# Patient Record
Sex: Female | Born: 1990 | Race: White | Hispanic: No | Marital: Single | Smoking: Never smoker
Health system: Southern US, Community
[De-identification: ages and names within clinical notes are randomized; demographics above are authoritative.]

## PROBLEM LIST (undated history)

## (undated) DIAGNOSIS — S069X9A Unspecified intracranial injury with loss of consciousness of unspecified duration, initial encounter: Secondary | ICD-10-CM

## (undated) DIAGNOSIS — T8859XA Other complications of anesthesia, initial encounter: Secondary | ICD-10-CM

## (undated) DIAGNOSIS — F909 Attention-deficit hyperactivity disorder, unspecified type: Secondary | ICD-10-CM

## (undated) DIAGNOSIS — Z8489 Family history of other specified conditions: Secondary | ICD-10-CM

## (undated) DIAGNOSIS — F429 Obsessive-compulsive disorder, unspecified: Secondary | ICD-10-CM

## (undated) DIAGNOSIS — Z9889 Other specified postprocedural states: Secondary | ICD-10-CM

## (undated) DIAGNOSIS — G43909 Migraine, unspecified, not intractable, without status migrainosus: Secondary | ICD-10-CM

## (undated) DIAGNOSIS — J189 Pneumonia, unspecified organism: Secondary | ICD-10-CM

## (undated) DIAGNOSIS — I10 Essential (primary) hypertension: Secondary | ICD-10-CM

## (undated) DIAGNOSIS — E282 Polycystic ovarian syndrome: Secondary | ICD-10-CM

## (undated) HISTORY — DX: Attention-deficit hyperactivity disorder, unspecified type: F90.9

## (undated) HISTORY — DX: Unspecified intracranial injury with loss of consciousness of unspecified duration, initial encounter: S06.9X9A

## (undated) HISTORY — PX: KNEE SURGERY: SHX244

---
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
## 2009-10-09 HISTORY — PX: RHINOPLASTY: SUR1284

## 2010-10-09 DIAGNOSIS — S069X9A Unspecified intracranial injury with loss of consciousness of unspecified duration, initial encounter: Secondary | ICD-10-CM

## 2010-10-09 DIAGNOSIS — S069XAA Unspecified intracranial injury with loss of consciousness status unknown, initial encounter: Secondary | ICD-10-CM

## 2010-10-09 HISTORY — DX: Unspecified intracranial injury with loss of consciousness status unknown, initial encounter: S06.9XAA

## 2010-10-09 HISTORY — DX: Unspecified intracranial injury with loss of consciousness of unspecified duration, initial encounter: S06.9X9A

## 2011-03-23 ENCOUNTER — Encounter: Payer: Self-pay | Admitting: Family Medicine

## 2011-06-28 ENCOUNTER — Emergency Department (HOSPITAL_COMMUNITY)
Admission: EM | Admit: 2011-06-28 | Discharge: 2011-06-28 | Disposition: A | Discharge status: Home / Self Care | Attending: Emergency Medicine | Admitting: Emergency Medicine

## 2011-06-28 ENCOUNTER — Emergency Department (HOSPITAL_COMMUNITY)

## 2011-06-28 DIAGNOSIS — Y92009 Unspecified place in unspecified non-institutional (private) residence as the place of occurrence of the external cause: Secondary | ICD-10-CM | POA: Insufficient documentation

## 2011-06-28 DIAGNOSIS — R42 Dizziness and giddiness: Secondary | ICD-10-CM | POA: Insufficient documentation

## 2011-06-28 DIAGNOSIS — S060X0A Concussion without loss of consciousness, initial encounter: Secondary | ICD-10-CM | POA: Insufficient documentation

## 2011-06-28 DIAGNOSIS — IMO0002 Reserved for concepts with insufficient information to code with codable children: Secondary | ICD-10-CM | POA: Insufficient documentation

## 2011-06-28 DIAGNOSIS — R51 Headache: Secondary | ICD-10-CM | POA: Insufficient documentation

## 2015-06-19 ENCOUNTER — Encounter (HOSPITAL_COMMUNITY): Payer: Self-pay | Admitting: Emergency Medicine

## 2015-06-19 ENCOUNTER — Emergency Department (HOSPITAL_COMMUNITY)

## 2015-06-19 ENCOUNTER — Emergency Department (HOSPITAL_COMMUNITY)
Admission: EM | Admit: 2015-06-19 | Discharge: 2015-06-20 | Disposition: A | Discharge status: Home / Self Care | Payer: Self-pay | Attending: Emergency Medicine | Admitting: Emergency Medicine

## 2015-06-19 DIAGNOSIS — Z3202 Encounter for pregnancy test, result negative: Secondary | ICD-10-CM | POA: Insufficient documentation

## 2015-06-19 DIAGNOSIS — Z79899 Other long term (current) drug therapy: Secondary | ICD-10-CM | POA: Insufficient documentation

## 2015-06-19 DIAGNOSIS — N200 Calculus of kidney: Secondary | ICD-10-CM | POA: Insufficient documentation

## 2015-06-19 LAB — COMPREHENSIVE METABOLIC PANEL
ALBUMIN: 5 g/dL (ref 3.5–5.0)
ALT: 94 U/L — ABNORMAL HIGH (ref 14–54)
ANION GAP: 8 (ref 5–15)
AST: 56 U/L — ABNORMAL HIGH (ref 15–41)
Alkaline Phosphatase: 70 U/L (ref 38–126)
BILIRUBIN TOTAL: 0.6 mg/dL (ref 0.3–1.2)
BUN: 12 mg/dL (ref 6–20)
CO2: 31 mmol/L (ref 22–32)
Calcium: 9.9 mg/dL (ref 8.9–10.3)
Chloride: 102 mmol/L (ref 101–111)
Creatinine, Ser: 1.24 mg/dL — ABNORMAL HIGH (ref 0.44–1.00)
GFR calc Af Amer: >60 mL/min (ref >60)
GFR, EST NON AFRICAN AMERICAN: 59 mL/min — AB (ref >60)
Glucose, Bld: 123 mg/dL — ABNORMAL HIGH (ref 65–99)
POTASSIUM: 3.6 mmol/L (ref 3.5–5.1)
Sodium: 141 mmol/L (ref 135–145)
TOTAL PROTEIN: 8.9 g/dL — AB (ref 6.5–8.1)

## 2015-06-19 LAB — URINALYSIS, ROUTINE W REFLEX MICROSCOPIC
BILIRUBIN URINE: NEGATIVE
Glucose, UA: NEGATIVE mg/dL
KETONES UR: NEGATIVE mg/dL
Leukocytes, UA: NEGATIVE
NITRITE: NEGATIVE
PROTEIN: NEGATIVE mg/dL
Specific Gravity, Urine: 1.02 (ref 1.005–1.030)
UROBILINOGEN UA: 0.2 mg/dL (ref 0.0–1.0)
pH: 6.5 (ref 5.0–8.0)

## 2015-06-19 LAB — CBC WITH DIFFERENTIAL/PLATELET
BASOS PCT: 0 % (ref 0–1)
Basophils Absolute: 0 10*3/uL (ref 0.0–0.1)
EOS PCT: 0 % (ref 0–5)
Eosinophils Absolute: 0 10*3/uL (ref 0.0–0.7)
HEMATOCRIT: 45 % (ref 36.0–46.0)
Hemoglobin: 15.5 g/dL — ABNORMAL HIGH (ref 12.0–15.0)
Lymphocytes Relative: 16 % (ref 12–46)
Lymphs Abs: 2.1 10*3/uL (ref 0.7–4.0)
MCH: 31.5 pg (ref 26.0–34.0)
MCHC: 34.4 g/dL (ref 30.0–36.0)
MCV: 91.5 fL (ref 78.0–100.0)
MONO ABS: 0.7 10*3/uL (ref 0.1–1.0)
MONOS PCT: 5 % (ref 3–12)
NEUTROS ABS: 10.2 10*3/uL — AB (ref 1.7–7.7)
Neutrophils Relative %: 79 % — ABNORMAL HIGH (ref 43–77)
Platelets: 329 10*3/uL (ref 150–400)
RBC: 4.92 MIL/uL (ref 3.87–5.11)
RDW: 12.4 % (ref 11.5–15.5)
WBC: 13 10*3/uL — ABNORMAL HIGH (ref 4.0–10.5)

## 2015-06-19 LAB — URINE MICROSCOPIC-ADD ON

## 2015-06-19 LAB — PREGNANCY, URINE: PREG TEST UR: NEGATIVE

## 2015-06-19 LAB — LIPASE, BLOOD: LIPASE: 20 U/L — AB (ref 22–51)

## 2015-06-19 MED ORDER — IOHEXOL 300 MG/ML  SOLN
100.0000 mL | Freq: Once | INTRAMUSCULAR | Status: AC | PRN
  Administered 2015-06-19: 100 mL via INTRAVENOUS

## 2015-06-19 MED ORDER — HYDROMORPHONE HCL 1 MG/ML IJ SOLN
1.0000 mg | Freq: Once | INTRAMUSCULAR | Status: AC
  Administered 2015-06-19: 1 mg via INTRAVENOUS
  Filled 2015-06-19: qty 1

## 2015-06-19 MED ORDER — HYDROMORPHONE HCL 2 MG PO TABS: 1.0000 mg | ORAL_TABLET | ORAL | Status: DC | PRN

## 2015-06-19 MED ORDER — ONDANSETRON 4 MG PO TBDP
4.0000 mg | ORAL_TABLET | Freq: Once | ORAL | Status: AC
  Administered 2015-06-19: 4 mg via ORAL
  Filled 2015-06-19: qty 1

## 2015-06-19 MED ORDER — SODIUM CHLORIDE 0.9 % IV SOLN
1000.0000 mL | Freq: Once | INTRAVENOUS | Status: AC
  Administered 2015-06-19: 1000 mL via INTRAVENOUS

## 2015-06-19 MED ORDER — ONDANSETRON HCL 4 MG/2ML IJ SOLN
4.0000 mg | Freq: Once | INTRAMUSCULAR | Status: AC
  Administered 2015-06-19: 4 mg via INTRAVENOUS
  Filled 2015-06-19: qty 2

## 2015-06-19 MED ORDER — SODIUM CHLORIDE 0.9 % IV SOLN
1000.0000 mL | INTRAVENOUS | Status: DC
  Administered 2015-06-19: 1000 mL via INTRAVENOUS

## 2015-06-19 MED ORDER — IOHEXOL 300 MG/ML  SOLN
25.0000 mL | Freq: Once | INTRAMUSCULAR | Status: AC | PRN
  Administered 2015-06-19: 25 mL via ORAL

## 2015-06-19 MED ORDER — GI COCKTAIL ~~LOC~~
30.0000 mL | Freq: Once | ORAL | Status: AC
  Administered 2015-06-19: 30 mL via ORAL
  Filled 2015-06-19: qty 30

## 2015-06-19 NOTE — ED Notes (Signed)
Patient transported to CT 

## 2015-06-19 NOTE — ED Provider Notes (Signed)
CSN: [id number]     Arrival date & time 06/19/15  1819 History   First MD Initiated Contact with Patient 06/19/15 1958     Chief Complaint  Patient presents with  . Abdominal Pain    right  . Emesis     (Consider location/radiation/quality/duration/timing/severity/associated sxs/prior Treatment) HPI  Stacye Noori is a 24 y.o. female with no significant PMH IBSD who presents with sudden onset right back pain that has since localized to the right lower quadrant. She describes it as sharp and constant and excruciating.  She endorses nausea, vomiting (x5), chills, and abdominal distention. Denies fever, hematuria, melena, hematochezia, urinary symptoms, chest pain, shortness of breath, and headache. No history abdominal surgeries. Her last BM was this morning at 8 AM.   History reviewed. No pertinent past medical history. Past Surgical History  Procedure Laterality Date  . Knee surgery Left   . Rhinoplasty  2011   No family history on file. Social History  Substance Use Topics  . Smoking status: Never Smoker   . Smokeless tobacco: None  . Alcohol Use: No   OB History    No data available     Review of Systems All other systems negative unless otherwise stated in HPI    Allergies  Acetaminophen and Augmentin  Home Medications   Prior to Admission medications   Medication Sig Start Date End Date Taking? Authorizing Provider  cetirizine (ZYRTEC) 10 MG tablet Take 10 mg by mouth daily.   Yes Historical Provider, MD  Multiple Vitamins-Minerals (MULTIVITAMIN ADULT PO) Take 1 tablet by mouth daily.   Yes Historical Provider, MD  omeprazole (PRILOSEC) 20 MG capsule Take 20 mg by mouth daily.   Yes Historical Provider, MD  phentermine (ADIPEX-P) 37.5 MG tablet Take 37.5 mg by mouth daily. 05/31/15  Yes Historical Provider, MD   BP 162/98 mmHg  Pulse 88  Temp(Src) 98 F (36.7 C) (Oral)  Resp 20  SpO2 92% Physical Exam  Constitutional: She is oriented to person, place, and  time. She appears well-developed and well-nourished.  HENT:  Head: Normocephalic and atraumatic.  Eyes: Conjunctivae are normal.  Neck: Normal range of motion. Neck supple.  Cardiovascular: Normal rate, regular rhythm and normal heart sounds.   No murmur heard. Pulmonary/Chest: Effort normal and breath sounds normal. No respiratory distress. She has no wheezes. She has no rales.  Abdominal: Soft. Bowel sounds are normal. She exhibits no distension. There is tenderness in the right upper quadrant and right lower quadrant. There is no rigidity, no rebound, no guarding, no CVA tenderness and no tenderness at McBurney's point.  Musculoskeletal: Normal range of motion.  Lymphadenopathy:    She has no cervical adenopathy.  Neurological: She is alert and oriented to person, place, and time.  Skin: Skin is warm and dry.  Psychiatric: She has a normal mood and affect. Her behavior is normal.    ED Course  Procedures (including critical care time) Labs Review Labs Reviewed  CBC WITH DIFFERENTIAL/PLATELET - Abnormal; Notable for the following:    WBC 13.0 (*)    Hemoglobin 15.5 (*)    Neutrophils Relative % 79 (*)    Neutro Abs 10.2 (*)    All other components within normal limits  COMPREHENSIVE METABOLIC PANEL - Abnormal; Notable for the following:    Glucose, Bld 123 (*)    Creatinine, Ser 1.24 (*)    Total Protein 8.9 (*)    AST 56 (*)    ALT 94 (*)  GFR calc non Af Amer 59 (*)    All other components within normal limits  LIPASE, BLOOD - Abnormal; Notable for the following:    Lipase 20 (*)    All other components within normal limits  URINALYSIS, ROUTINE W REFLEX MICROSCOPIC (NOT AT [facility]) - Abnormal; Notable for the following:    APPearance CLOUDY (*)    Hgb urine dipstick LARGE (*)    All other components within normal limits  URINE MICROSCOPIC-ADD ON - Abnormal; Notable for the following:    Bacteria, UA FEW (*)    All other components within normal limits  PREGNANCY, URINE     Imaging Review Ct Abdomen Pelvis W Contrast  06/19/2015   CLINICAL DATA:  24 year old female with right lower quadrant abdominal pain  EXAM: CT ABDOMEN AND PELVIS WITH CONTRAST  TECHNIQUE: Multidetector CT imaging of the abdomen and pelvis was performed using the standard protocol following bolus administration of intravenous contrast.  CONTRAST:  OMNIPAQUE IOHEXOL 300 MG/ML SOLN, 25mL OMNIPAQUE IOHEXOL 300 MG/ML SOLN  COMPARISON:  None.  FINDINGS: The visualized lung bases are clear. No intra-abdominal free air or free fluid.  Diffuse hepatic steatosis. The gallbladder, pancreas appear unremarkable. There is a 2 cm cyst or hemangioma in the spleen. The adrenal glands appear unremarkable. There is a 3 mm nonobstructing left renal inferior pole calculus. There is no hydronephrosis on the left. The left ureter is unremarkable. There is mild right hydronephroureter . No stone identified in the right kidney. There is a 3 mm calculus along the posterior wall of the urinary bladder, likely a recently passed right renal stone. The ovaries and uterus are grossly unremarkable.  There is no evidence of bowel obstruction or inflammation. Normal appendix. Oral contrast noted within the distal esophagus, likely related to a degree of gastroesophageal reflux.  The abdominal aorta and IVC appear unremarkable. No portal venous gas identified. There is no adenopathy. The osseous structures appear unremarkable.  IMPRESSION: A 3 mm recently passed right renal stone within the urinary bladder with mild right hydronephroureter kicked.  No evidence of bowel obstruction or inflammation.  Normal appendix.   Electronically Signed   By: Elgie Collard M.D.   On: 06/19/2015 23:07   I have personally reviewed and evaluated these images and lab results as part of my medical decision-making.   EKG Interpretation None      MDM   Final diagnoses:  None    Patient presents with RLQ pain.  On exam, RLQ and RUQ  tenderness.  Abdominal CT shows 3 mm recently passed right renal stone within the urinary bladder with mild right hydronephroureter.  Labs: UA shows gross hematuria. Cr 1.24, most likely due to nephrolithiasis. Low suspicion for appendicitis, ectopic pregnancy.  Suspect nephrolithiasis and stone passage.  Pt given Dilaudid in ED for pain control. Pt stable for d/c.  Pt D/C home with urine strainer and pain medication.  Advised to follow up in 2 days with urology.  Discussed return precautions and supportive care.  Patient acknowledges and agrees with the above plan.     Cheri Fowler, PA-C 06/19/15 2331  Laurence Spates, MD 06/19/15 [phone]

## 2015-06-19 NOTE — Discharge Instructions (Signed)

## 2015-06-19 NOTE — ED Notes (Signed)
Pt c/o right sided abd pain, vomiting, mucous and bile in stool. All started this afternoon. Pt states "i think my appendix has burst or is about to"

## 2015-10-10 DIAGNOSIS — Z87442 Personal history of urinary calculi: Secondary | ICD-10-CM

## 2015-10-10 HISTORY — DX: Personal history of urinary calculi: Z87.442

## 2015-12-27 DIAGNOSIS — F419 Anxiety disorder, unspecified: Secondary | ICD-10-CM | POA: Insufficient documentation

## 2015-12-27 DIAGNOSIS — F32A Depression, unspecified: Secondary | ICD-10-CM | POA: Insufficient documentation

## 2015-12-27 DIAGNOSIS — G43909 Migraine, unspecified, not intractable, without status migrainosus: Secondary | ICD-10-CM | POA: Insufficient documentation

## 2015-12-27 DIAGNOSIS — J309 Allergic rhinitis, unspecified: Secondary | ICD-10-CM | POA: Insufficient documentation

## 2015-12-27 DIAGNOSIS — F329 Major depressive disorder, single episode, unspecified: Secondary | ICD-10-CM | POA: Insufficient documentation

## 2015-12-27 DIAGNOSIS — E282 Polycystic ovarian syndrome: Secondary | ICD-10-CM | POA: Insufficient documentation

## 2015-12-27 DIAGNOSIS — K219 Gastro-esophageal reflux disease without esophagitis: Secondary | ICD-10-CM | POA: Insufficient documentation

## 2015-12-27 DIAGNOSIS — E785 Hyperlipidemia, unspecified: Secondary | ICD-10-CM | POA: Insufficient documentation

## 2016-06-13 DIAGNOSIS — J22 Unspecified acute lower respiratory infection: Secondary | ICD-10-CM | POA: Diagnosis not present

## 2016-11-13 DIAGNOSIS — R509 Fever, unspecified: Secondary | ICD-10-CM | POA: Diagnosis not present

## 2016-11-13 DIAGNOSIS — J101 Influenza due to other identified influenza virus with other respiratory manifestations: Secondary | ICD-10-CM | POA: Diagnosis not present

## 2018-01-11 ENCOUNTER — Encounter (HOSPITAL_COMMUNITY): Payer: Self-pay | Admitting: *Deleted

## 2018-01-11 ENCOUNTER — Emergency Department (HOSPITAL_COMMUNITY)
Admission: EM | Admit: 2018-01-11 | Discharge: 2018-01-11 | Disposition: A | Discharge status: Home / Self Care | Payer: PRIVATE HEALTH INSURANCE | Attending: Emergency Medicine | Admitting: Emergency Medicine

## 2018-01-11 ENCOUNTER — Emergency Department (HOSPITAL_COMMUNITY): Payer: PRIVATE HEALTH INSURANCE

## 2018-01-11 ENCOUNTER — Other Ambulatory Visit: Payer: Self-pay

## 2018-01-11 DIAGNOSIS — S0990XA Unspecified injury of head, initial encounter: Secondary | ICD-10-CM

## 2018-01-11 DIAGNOSIS — M542 Cervicalgia: Secondary | ICD-10-CM | POA: Insufficient documentation

## 2018-01-11 DIAGNOSIS — S0003XA Contusion of scalp, initial encounter: Secondary | ICD-10-CM | POA: Diagnosis not present

## 2018-01-11 DIAGNOSIS — Y999 Unspecified external cause status: Secondary | ICD-10-CM | POA: Insufficient documentation

## 2018-01-11 DIAGNOSIS — Y9389 Activity, other specified: Secondary | ICD-10-CM | POA: Insufficient documentation

## 2018-01-11 DIAGNOSIS — Z79899 Other long term (current) drug therapy: Secondary | ICD-10-CM | POA: Insufficient documentation

## 2018-01-11 DIAGNOSIS — Y92538 Other ambulatory health services establishments as the place of occurrence of the external cause: Secondary | ICD-10-CM | POA: Diagnosis not present

## 2018-01-11 HISTORY — DX: Polycystic ovarian syndrome: E28.2

## 2018-01-11 HISTORY — DX: Migraine, unspecified, not intractable, without status migrainosus: G43.909

## 2018-01-11 LAB — I-STAT CHEM 8, ED
BUN: 10 mg/dL (ref 6–20)
CALCIUM ION: 1.14 mmol/L — AB (ref 1.15–1.40)
CHLORIDE: 100 mmol/L — AB (ref 101–111)
Creatinine, Ser: 0.8 mg/dL (ref 0.44–1.00)
Glucose, Bld: 97 mg/dL (ref 65–99)
HCT: 47 % — ABNORMAL HIGH (ref 36.0–46.0)
Hemoglobin: 16 g/dL — ABNORMAL HIGH (ref 12.0–15.0)
Potassium: 3.6 mmol/L (ref 3.5–5.1)
SODIUM: 140 mmol/L (ref 135–145)
TCO2: 29 mmol/L (ref 22–32)

## 2018-01-11 MED ORDER — SODIUM CHLORIDE 0.9 % IV BOLUS
1000.0000 mL | Freq: Once | INTRAVENOUS | Status: AC
  Administered 2018-01-11: 1000 mL via INTRAVENOUS

## 2018-01-11 MED ORDER — METOCLOPRAMIDE HCL 5 MG/ML IJ SOLN
10.0000 mg | Freq: Once | INTRAMUSCULAR | Status: AC
  Administered 2018-01-11: 10 mg via INTRAVENOUS
  Filled 2018-01-11: qty 2

## 2018-01-11 MED ORDER — DIPHENHYDRAMINE HCL 50 MG/ML IJ SOLN
25.0000 mg | Freq: Once | INTRAMUSCULAR | Status: AC
  Administered 2018-01-11: 25 mg via INTRAVENOUS
  Filled 2018-01-11: qty 1

## 2018-01-11 MED ORDER — IOPAMIDOL (ISOVUE-370) INJECTION 76%
INTRAVENOUS | Status: AC
  Filled 2018-01-11: qty 50

## 2018-01-11 MED ORDER — IOPAMIDOL (ISOVUE-300) INJECTION 61%: 100.0000 mL | Freq: Once | INTRAVENOUS | Status: DC | PRN

## 2018-01-11 MED ORDER — IOPAMIDOL (ISOVUE-370) INJECTION 76%
50.0000 mL | Freq: Once | INTRAVENOUS | Status: AC | PRN
  Administered 2018-01-11: 50 mL via INTRAVENOUS

## 2018-01-11 NOTE — Discharge Instructions (Signed)
CT's looked great! :) Follow-up with your primary. Results of CT's on back for employee health so they can review and add to file. Return to ED immediately for any new/acute changes-- severe headache/neck pain, blurred vision, confusion, dizziness, focal numbness/weakness, etc.
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
## 2018-02-12 ENCOUNTER — Encounter: Payer: Self-pay | Admitting: Family Medicine

## 2018-02-12 ENCOUNTER — Ambulatory Visit (INDEPENDENT_AMBULATORY_CARE_PROVIDER_SITE_OTHER): Payer: No Typology Code available for payment source | Admitting: Family Medicine

## 2018-02-12 VITALS — BP 142/99 | HR 83 | Temp 98.2°F | Ht 64.0 in | Wt 213.0 lb

## 2018-02-12 DIAGNOSIS — Z803 Family history of malignant neoplasm of breast: Secondary | ICD-10-CM | POA: Diagnosis not present

## 2018-02-12 DIAGNOSIS — E282 Polycystic ovarian syndrome: Secondary | ICD-10-CM | POA: Diagnosis not present

## 2018-02-12 LAB — COMPREHENSIVE METABOLIC PANEL
ALT: 27 U/L (ref 0–35)
AST: 19 U/L (ref 0–37)
Albumin: 4.4 g/dL (ref 3.5–5.2)
Alkaline Phosphatase: 60 U/L (ref 39–117)
BUN: 10 mg/dL (ref 6–23)
CO2: 32 mEq/L (ref 19–32)
Calcium: 9.6 mg/dL (ref 8.4–10.5)
Chloride: 100 mEq/L (ref 96–112)
Creatinine, Ser: 0.79 mg/dL (ref 0.40–1.20)
GFR: 91.38 mL/min (ref >60.00)
Glucose, Bld: 113 mg/dL — ABNORMAL HIGH (ref 70–99)
Potassium: 3.9 mEq/L (ref 3.5–5.1)
Sodium: 139 mEq/L (ref 135–145)
Total Bilirubin: 0.4 mg/dL (ref 0.2–1.2)
Total Protein: 7.5 g/dL (ref 6.0–8.3)

## 2018-02-12 LAB — CBC WITH DIFFERENTIAL/PLATELET
Basophils Absolute: 0 10*3/uL (ref 0.0–0.1)
Basophils Relative: 0.7 % (ref 0.0–3.0)
Eosinophils Absolute: 0.1 10*3/uL (ref 0.0–0.7)
Eosinophils Relative: 2 % (ref 0.0–5.0)
HCT: 44.2 % (ref 36.0–46.0)
Hemoglobin: 14.8 g/dL (ref 12.0–15.0)
Lymphocytes Relative: 41.2 % (ref 12.0–46.0)
Lymphs Abs: 2.5 10*3/uL (ref 0.7–4.0)
MCHC: 33.5 g/dL (ref 30.0–36.0)
MCV: 91.7 fl (ref 78.0–100.0)
Monocytes Absolute: 0.5 10*3/uL (ref 0.1–1.0)
Monocytes Relative: 8.8 % (ref 3.0–12.0)
Neutro Abs: 2.9 10*3/uL (ref 1.4–7.7)
Neutrophils Relative %: 47.3 % (ref 43.0–77.0)
Platelets: 333 10*3/uL (ref 150.0–400.0)
RBC: 4.82 Mil/uL (ref 3.87–5.11)
RDW: 13.4 % (ref 11.5–15.5)
WBC: 6.1 10*3/uL (ref 4.0–10.5)

## 2018-02-12 LAB — HEMOGLOBIN A1C: Hgb A1c MFr Bld: 6 % (ref 4.6–6.5)

## 2018-02-12 LAB — VITAMIN D 25 HYDROXY (VIT D DEFICIENCY, FRACTURES): VITD: 23.82 ng/mL — ABNORMAL LOW (ref 30.00–100.00)

## 2018-02-12 LAB — TSH: TSH: 0.89 u[IU]/mL (ref 0.35–4.50)

## 2018-02-12 MED ORDER — SPIRONOLACTONE 25 MG PO TABS: 25.0000 mg | ORAL_TABLET | Freq: Every day | ORAL | 2 refills | Status: DC

## 2018-02-12 MED ORDER — METFORMIN HCL ER 750 MG PO TB24: 750.0000 mg | ORAL_TABLET | Freq: Every day | ORAL | 2 refills | Status: DC

## 2018-02-12 MED FILL — METFORMIN HCL ER 750 MG TAB: 750 | 30 days supply | Qty: 30 | Fill #0

## 2018-02-12 MED FILL — SPIRONOLACTONE 25 MG TABLET: 25 | 30 days supply | Qty: 30 | Fill #0

## 2018-02-12 NOTE — Progress Notes (Signed)
Jennifer Solis is a 27 y.o. female is here TO ESTABLISH CARE.  History of Present Illness:   HPI: Patient with history of PCOS that was diagnosed at age 72.  Previously on metformin but medication dropped off when she moved in her early 90s.  Has been working on a [employer] and exercising regularly.  States that she has dropped about 50 pounds on her own.  I have difficult time with the plateau and finds it very difficult to get under 200 pounds.  History of generalized anxiety disorder and whitecoat hypertension.  Has high blood pressure when she comes into the office.  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  States that it is always controlled and in the 1 teens to 120s systolic.  Tried phentermine in the past and it elevated her blood pressure as well.  She is never been on a blood pressure medication.  Strong family history of breast cancer.  A maternal cousin recently died in her 30s due to breast cancer.  States that she had a left ultrasound that was abnormal in the past and was supposed to have it followed but has not done this.  Interested in a mammogram.  No history of the Pap.  She has never been sexually active.  Health Maintenance Due  Topic Date Due  . HIV Screening  01/06/2004  . PAP SMEAR  01/05/2010   No flowsheet data found.   PMHx, SurgHx, SocialHx, FamHx, Medications, and Allergies were reviewed in the Visit Navigator and updated as appropriate.   Patient Active Problem List   Diagnosis Date Noted  . Allergic rhinitis 12/27/2015  . Anxiety 12/27/2015  . Depression 12/27/2015  . GERD (gastroesophageal reflux disease) 12/27/2015  . Hyperlipidemia 12/27/2015  . Migraine 12/27/2015  . PCOS (polycystic ovarian syndrome) 12/27/2015   Social History   Tobacco Use  . Smoking status: Never Smoker  . Smokeless tobacco: Never Used  Substance Use Topics  . Alcohol use: No  . Drug use: Never   Current Medications and Allergies:    Current Outpatient Medications:  .  cetirizine (ZYRTEC) 10 MG tablet, Take 10 mg by mouth daily., Disp: , Rfl:  .  metFORMIN (GLUCOPHAGE XR) 750 MG 24 hr tablet, Take 1 tablet (750 mg total) by mouth daily with breakfast., Disp: 30 tablet, Rfl: 2 .  Multiple Vitamins-Minerals (MULTIVITAMIN ADULT PO), Take 1 tablet by mouth daily., Disp: , Rfl:  .  spironolactone (ALDACTONE) 25 MG tablet, Take 1 tablet (25 mg total) by mouth daily., Disp: 30 tablet, Rfl: 2   Allergies  Allergen Reactions  . Acetaminophen Other (See Comments)    paralysis  . Augmentin [Amoxicillin-Pot Clavulanate] Nausea And Vomiting   Review of Systems   Pertinent items are noted in the HPI. Otherwise, ROS is negative.  Vitals:   Vitals:   02/12/18 0931  BP: (!) 142/99  Pulse: 83  Temp: 98.2 F (36.8 C)  TempSrc: Oral  SpO2: 98%  Weight: 213 lb (96.6 kg)  Height:  (1.626 m)     Body mass index is 36.56 kg/m.   Physical Exam:   Physical Exam  Constitutional: She is oriented to person, place, and time. She appears well-developed and well-nourished. No distress.  HENT:  Head: Normocephalic and atraumatic.  Right Ear: External ear normal.  Left Ear: External ear normal.  Nose: Nose normal.  Mouth/Throat: Oropharynx is clear and moist.  Eyes: Pupils are equal, round,  and reactive to light. Conjunctivae and EOM are normal.  Neck: Normal range of motion. Neck supple. No thyromegaly present.  Cardiovascular: Normal rate, regular rhythm, normal heart sounds and intact distal pulses.  Pulmonary/Chest: Effort normal and breath sounds normal.  Abdominal: Soft. Bowel sounds are normal.  Musculoskeletal: Normal range of motion.  Lymphadenopathy:    She has no cervical adenopathy.  Neurological: She is alert and oriented to person, place, and time.  Skin: Skin is warm and dry. Capillary refill takes less than 2 seconds.  Psychiatric: She has a normal mood and affect. Her behavior is normal.  Nursing  note and vitals reviewed.   Assessment and Plan:   Deanda was seen today for establish care.  Diagnoses and all orders for this visit:  PCOS (polycystic ovarian syndrome) -     spironolactone (ALDACTONE) 25 MG tablet; Take 1 tablet (25 mg total) by mouth daily. -     metFORMIN (GLUCOPHAGE XR) 750 MG 24 hr tablet; Take 1 tablet (750 mg total) by mouth daily with breakfast. -     CBC with Differential/Platelet -     Comprehensive metabolic panel -     Hemoglobin A1c -     TSH -     VITAMIN D 25 Hydroxy (Vit-D Deficiency, Fractures)  Family history of breast cancer -     US BREAST LTD UNI LEFT INC AXILLA; Future   . Reviewed expectations re: course of current medical issues. . Discussed self-management of symptoms. . Outlined signs and symptoms indicating need for more acute intervention. . Patient verbalized understanding and all questions were answered. Marland Kitchen Health Maintenance issues including appropriate healthy diet, exercise, and smoking avoidance were discussed with patient. . See orders for this visit as documented in the electronic medical record. . Patient received an After Visit Summary.  Helane Rima, DO [place], [facility] 02/12/2018  Future Appointments  Date Time Provider Department Center  05/14/2018 10:20 AM Helane Rima, DO [facility]

## 2018-02-24 ENCOUNTER — Encounter: Payer: Self-pay | Admitting: Family Medicine

## 2018-02-25 MED ORDER — CHOLECALCIFEROL 1.25 MG (50000 UT) PO TABS: ORAL_TABLET | ORAL | 0 refills | Status: DC

## 2018-04-02 MED FILL — CLINDAMYCIN HCL 150 MG CAPS: 150 | 7 days supply | Qty: 56 | Fill #0

## 2018-04-08 ENCOUNTER — Ambulatory Visit
Admission: RE | Admit: 2018-04-08 | Discharge: 2018-04-08 | Disposition: A | Discharge status: Home / Self Care | Payer: No Typology Code available for payment source | Source: Ambulatory Visit | Attending: Family Medicine | Admitting: Family Medicine

## 2018-04-08 DIAGNOSIS — Z803 Family history of malignant neoplasm of breast: Secondary | ICD-10-CM

## 2018-05-14 ENCOUNTER — Encounter: Payer: Self-pay | Admitting: Family Medicine

## 2018-05-14 ENCOUNTER — Ambulatory Visit (INDEPENDENT_AMBULATORY_CARE_PROVIDER_SITE_OTHER): Payer: No Typology Code available for payment source | Admitting: Family Medicine

## 2018-05-14 VITALS — BP 134/96 | HR 76 | Temp 98.4°F | Ht 64.0 in | Wt 215.6 lb

## 2018-05-14 DIAGNOSIS — E282 Polycystic ovarian syndrome: Secondary | ICD-10-CM | POA: Diagnosis not present

## 2018-05-14 DIAGNOSIS — F902 Attention-deficit hyperactivity disorder, combined type: Secondary | ICD-10-CM

## 2018-05-14 DIAGNOSIS — E8881 Metabolic syndrome: Secondary | ICD-10-CM | POA: Diagnosis not present

## 2018-05-14 DIAGNOSIS — K580 Irritable bowel syndrome with diarrhea: Secondary | ICD-10-CM | POA: Diagnosis not present

## 2018-05-14 DIAGNOSIS — K589 Irritable bowel syndrome without diarrhea: Secondary | ICD-10-CM | POA: Insufficient documentation

## 2018-05-14 DIAGNOSIS — E88819 Insulin resistance, unspecified: Secondary | ICD-10-CM

## 2018-05-14 MED ORDER — SPIRONOLACTONE 50 MG PO TABS: 50.0000 mg | ORAL_TABLET | Freq: Every day | ORAL | 2 refills | Status: DC

## 2018-05-14 MED ORDER — CHOLECALCIFEROL 1.25 MG (50000 UT) PO TABS: ORAL_TABLET | ORAL | 0 refills | Status: DC

## 2018-05-14 NOTE — Progress Notes (Signed)
Jennifer Solis is a 27 y.o. female is here for follow up.  History of Present Illness:   Jennifer Solis CMA acting as scribe for Dr. Earlene Plater.  HPI: Patient comes in today for her three month follow up. Patient stated that she has not been taking her Metformin due to her IBS. She was not able to tolerate.   Health Maintenance Due  Topic Date Due  . INFLUENZA VACCINE  05/09/2018   No flowsheet data found.   PMHx, SurgHx, SocialHx, FamHx, Medications, and Allergies were reviewed in the Visit Navigator and updated as appropriate.   Patient Active Problem List   Diagnosis Date Noted  . IBS (irritable bowel syndrome) - D 05/14/2018  . Allergic rhinitis 12/27/2015  . Anxiety 12/27/2015  . Depression 12/27/2015  . GERD (gastroesophageal reflux disease) 12/27/2015  . Hyperlipidemia 12/27/2015  . Migraine 12/27/2015  . PCOS (polycystic ovarian syndrome) 12/27/2015   Social History   Tobacco Use  . Smoking status: Never Smoker  . Smokeless tobacco: Never Used  Substance Use Topics  . Alcohol use: No  . Drug use: Never   Current Medications and Allergies:   .  cetirizine (ZYRTEC) 10 MG tablet, Take 10 mg by mouth daily., Disp: , Rfl:  .  Multiple Vitamins-Minerals (MULTIVITAMIN ADULT PO), Take 1 tablet by mouth daily., Disp: , Rfl:   Allergies  Allergen Reactions  . Acetaminophen Other (See Comments)    paralysis  . Augmentin [Amoxicillin-Pot Clavulanate] Nausea And Vomiting   Review of Systems   Pertinent items are noted in the HPI. Otherwise, ROS is negative.  Vitals:   Vitals:   05/14/18 1016  BP: (!) 134/96  Pulse: 76  Temp: 98.4 F (36.9 C)  TempSrc: Oral  SpO2: 100%  Weight: 215 lb 9.6 oz (97.8 kg)  Height: 5\' 4"  (1.626 m)     Body mass index is 37.01 kg/m.  Physical Exam:   Physical Exam  Constitutional: She appears well-nourished.  HENT:  Head: Normocephalic and atraumatic.  Eyes: Pupils are equal, round, and reactive to light. EOM are normal.    Neck: Normal range of motion. Neck supple.  Cardiovascular: Normal rate, regular rhythm, normal heart sounds and intact distal pulses.  Pulmonary/Chest: Effort normal.  Abdominal: Soft.  Skin: Skin is warm.  Psychiatric: She has a normal mood and affect. Her behavior is normal.  Nursing note and vitals reviewed.   Assessment and Plan:   Jennifer Solis was seen today for follow-up.  Diagnoses and all orders for this visit:  Attention deficit hyperactivity disorder (ADHD), combined type Interested in work-up. Elevated BP with Hx of stimulant use.   Irritable bowel syndrome with diarrhea Unable to tolerate Metformin.  PCOS (polycystic ovarian syndrome) Spironolactone helping. Will increase to 50 mg daily.   Insulin resistance Patient will work on daily exercise in the form of walking. She will also be joining a gym.  . Reviewed expectations re: course of current medical issues. . Discussed self-management of symptoms. . Outlined signs and symptoms indicating need for more acute intervention. . Patient verbalized understanding and all questions were answered. Marland Kitchen Health Maintenance issues including appropriate healthy diet, exercise, and smoking avoidance were discussed with patient. . See orders for this visit as documented in the electronic medical record. . Patient received an After Visit Summary.  Helane Rima, DO [place], [facility] 05/14/2018  No future appointments.  CMA served as Neurosurgeon during this visit. History, Physical, and Plan performed by medical provider. The above documentation has  been reviewed and is accurate and complete. Helane RimaErica Jaxxson Cavanah, D.O.

## 2018-05-22 ENCOUNTER — Ambulatory Visit: Payer: Self-pay

## 2018-05-25 ENCOUNTER — Encounter: Payer: Self-pay | Admitting: Family Medicine

## 2018-05-27 ENCOUNTER — Other Ambulatory Visit: Payer: Self-pay

## 2018-05-27 DIAGNOSIS — F902 Attention-deficit hyperactivity disorder, combined type: Secondary | ICD-10-CM

## 2018-05-27 NOTE — Progress Notes (Signed)
e

## 2018-05-28 MED FILL — VIT D3-50 50,000 UNITS CAPS: 1.25 MG | 84 days supply | Qty: 12 | Fill #0

## 2018-05-28 MED FILL — SPIRONOLACTONE 50 MG TAB: 50 | 90 days supply | Qty: 90 | Fill #0

## 2018-07-26 ENCOUNTER — Telehealth: Payer: Self-pay | Admitting: Family Medicine

## 2018-07-26 NOTE — Telephone Encounter (Signed)
Called to follow up with patient - [facility] has been trying to get in touch with her since the referral was placed back in Aug.  I left her a message with their information letting her know that if she still wanted an appt then she needed to call them and schedule it.

## 2018-08-08 ENCOUNTER — Encounter: Payer: Self-pay | Admitting: Family Medicine

## 2018-08-13 ENCOUNTER — Ambulatory Visit: Payer: No Typology Code available for payment source | Admitting: Family Medicine

## 2018-08-18 NOTE — Progress Notes (Signed)
Jennifer Solis is a 27 y.o. female is here for follow up.  History of Present Illness:   Barnie Mort, CMA acting as scribe for Dr. Helane Rima.   HPI:   Patient in office for follow up on: 1. PCOS: Patient is taking the spironolactone with not problems.  2. Headaches: She has had increased headaches. She has had surgery in the past for deviated septum. She states that helped a lot with headaches. She had injury where she thinks she may have broken nose and would like referral to ENT to have evaluated.   3. She would like to start the HPV vaccine today.   4. She was seen by [facility] yesterday. Was given new Intuniv but has not started yet.   There are no preventive care reminders to display for this patient.   Depression screen PHQ 2/9 08/20/2018  Decreased Interest 1  Down, Depressed, Hopeless 0  PHQ - 2 Score 1  Altered sleeping 2  Tired, decreased energy 2  Change in appetite 1  Feeling bad or failure about yourself  0  Trouble concentrating 2  Moving slowly or fidgety/restless 1  Suicidal thoughts 0  PHQ-9 Score 9  Difficult doing work/chores Somewhat difficult     PMHx, SurgHx, SocialHx, FamHx, Medications, and Allergies were reviewed in the Visit Navigator and updated as appropriate.   Patient Active Problem List   Diagnosis Date Noted  . Deviated septum 08/22/2018  . Mixed obsessional thoughts and acts 08/22/2018  . GAD (generalized anxiety disorder) 08/22/2018  . OCD (obsessive compulsive disorder) 08/20/2018  . ADHD 08/20/2018  . IBS (irritable bowel syndrome) - D 05/14/2018  . Allergic rhinitis 12/27/2015  . Anxiety 12/27/2015  . Depression 12/27/2015  . GERD (gastroesophageal reflux disease) 12/27/2015  . Hyperlipidemia 12/27/2015  . Migraine 12/27/2015  . PCOS (polycystic ovarian syndrome) 12/27/2015   Social History   Tobacco Use  . Smoking status: Never Smoker  . Smokeless tobacco: Never Used  Substance Use Topics  .  Alcohol use: No  . Drug use: Never   Current Medications and Allergies:   .  cetirizine (ZYRTEC) 10 MG tablet, Take 10 mg by mouth daily., Disp: , Rfl:  .  Cholecalciferol 50000 units TABS, 50,000 units PO qwk for 12 weeks., Disp: 12 tablet, Rfl: 0 .  Multiple Vitamins-Minerals (MULTIVITAMIN ADULT PO), Take 1 tablet by mouth daily. Marland Kitchen  spironolactone (ALDACTONE) 50 MG tablet, Take 1 tablet (50 mg total) by mouth daily.   Allergies  Allergen Reactions  . Acetaminophen Other (See Comments)    paralysis  . Augmentin [Amoxicillin-Pot Clavulanate] Nausea And Vomiting   Review of Systems   Pertinent items are noted in the HPI. Otherwise, ROS is negative.  Vitals:   Vitals:   08/20/18 0915  BP: 130/90  Pulse: 85  Temp: 98.8 F (37.1 C)  TempSrc: Oral  SpO2: 98%  Weight: 215 lb (97.5 kg)  Height: 5\' 4"  (1.626 m)     Body mass index is 36.9 kg/m.  Physical Exam:   Physical Exam  Constitutional: She appears well-nourished.  HENT:  Head: Normocephalic and atraumatic.  Eyes: Pupils are equal, round, and reactive to light. EOM are normal.  Neck: Normal range of motion. Neck supple.  Cardiovascular: Normal rate, regular rhythm, normal heart sounds and intact distal pulses.  Pulmonary/Chest: Effort normal.  Abdominal: Soft.  Skin: Skin is warm.  Psychiatric: She has a normal mood and affect. Her behavior is normal.  Nursing note and vitals  reviewed.  Assessment and Plan:   Adina was seen today for follow-up.  Diagnoses and all orders for this visit:  PCOS (polycystic ovarian syndrome) Comments: Symptoms stable. Will continue with current treatment.  Orders: -     Hemoglobin A1c -     VITAMIN D 25 Hydroxy (Vit-D Deficiency, Fractures)  Pure hypercholesterolemia  Deviated septum -     Ambulatory referral to ENT  Other obsessive-compulsive disorders  Attention deficit hyperactivity disorder (ADHD), unspecified ADHD type Comments: Will start Intuniv today. Will  follow along.   Need for HPV vaccination -     HPV 9-valent vaccine,Recombinat  Mixed obsessional thoughts and acts  GAD (generalized anxiety disorder)   Orders Placed This Encounter  Procedures  . HPV 9-valent vaccine,Recombinat  . Hemoglobin A1c  . VITAMIN D 25 Hydroxy (Vit-D Deficiency, Fractures)  . Ambulatory referral to ENT   . Reviewed expectations re: course of current medical issues. . Discussed self-management of symptoms. . Outlined signs and symptoms indicating need for more acute intervention. . Patient verbalized understanding and all questions were answered. Marland Kitchen Health Maintenance issues including appropriate healthy diet, exercise, and smoking avoidance were discussed with patient. . See orders for this visit as documented in the electronic medical record. . Patient received an After Visit Summary.  Helane Rima, DO [place], [facility] 08/22/2018  CMA served as Neurosurgeon during this visit. History, Physical, and Plan performed by medical provider. The above documentation has been reviewed and is accurate and complete. Helane Rima, D.O.

## 2018-08-19 MED FILL — guanFACINE HCL ER 1 MG TB24: 1 | 30 days supply | Qty: 30 | Fill #0

## 2018-08-20 ENCOUNTER — Encounter: Payer: Self-pay | Admitting: Family Medicine

## 2018-08-20 ENCOUNTER — Ambulatory Visit (INDEPENDENT_AMBULATORY_CARE_PROVIDER_SITE_OTHER): Payer: No Typology Code available for payment source | Admitting: Family Medicine

## 2018-08-20 VITALS — BP 130/90 | HR 85 | Temp 98.8°F | Ht 64.0 in | Wt 215.0 lb

## 2018-08-20 DIAGNOSIS — E7800 Pure hypercholesterolemia, unspecified: Secondary | ICD-10-CM

## 2018-08-20 DIAGNOSIS — F428 Other obsessive-compulsive disorder: Secondary | ICD-10-CM | POA: Diagnosis not present

## 2018-08-20 DIAGNOSIS — E282 Polycystic ovarian syndrome: Secondary | ICD-10-CM | POA: Diagnosis not present

## 2018-08-20 DIAGNOSIS — F909 Attention-deficit hyperactivity disorder, unspecified type: Secondary | ICD-10-CM

## 2018-08-20 DIAGNOSIS — F422 Mixed obsessional thoughts and acts: Secondary | ICD-10-CM

## 2018-08-20 DIAGNOSIS — F411 Generalized anxiety disorder: Secondary | ICD-10-CM

## 2018-08-20 DIAGNOSIS — J342 Deviated nasal septum: Secondary | ICD-10-CM

## 2018-08-20 DIAGNOSIS — F429 Obsessive-compulsive disorder, unspecified: Secondary | ICD-10-CM | POA: Insufficient documentation

## 2018-08-20 DIAGNOSIS — Z23 Encounter for immunization: Secondary | ICD-10-CM

## 2018-08-20 LAB — VITAMIN D 25 HYDROXY (VIT D DEFICIENCY, FRACTURES): VITD: 33.58 ng/mL (ref 30.00–100.00)

## 2018-08-20 LAB — HEMOGLOBIN A1C: Hgb A1c MFr Bld: 6 % (ref 4.6–6.5)

## 2018-08-20 NOTE — Patient Instructions (Signed)
Vaccine Information Statement    HPV (Human Papillomavirus) Vaccine: What You Need to Know    Many Vaccine Information Statements are available in Spanish and other languages. See www.immunize.org/vis.  Hojas de Informacin Sobre Vacunas estn disponibles en espaol y en muchos otros idiomas. Visite http://www.immunize.org/vis.    1. Why get vaccinated?    HPV vaccine prevents infection with human papillomavirus (HPV) types that are associated with many cancers, including:    . cervical cancer in females,  . vaginal and vulvar cancers in females,   . anal cancer in females and males,  . throat cancer in females and males, and  . penile cancer in males.     In addition, HPV vaccine prevents infection with HPV types that cause genital warts in both females and males.    In the [place] about 12,000 women get cervical cancer every year, and about 4,000 women die from it. HPV vaccine can prevent most of these cases of cervical cancer.    Vaccination is not a substitute for cervical cancer screening. This vaccine does not protect against all HPV types that can cause cervical cancer.  Women should still get regular Pap tests.    HPV infection usually comes from sexual contact, and most people will become infected at some point in their life. About 14 million Americans, including teens, get infected every year.  Most infections will go away on their own and not cause serious problems. But thousands of women and men get cancer and other diseases from HPV.       2. HPV vaccine    HPV vaccine is approved by FDA and is recommended by CDC for both males and females. It is routinely given at 11 or 29 years of age, but it may be given beginning at age 9 years through age 26 years.      Most adolescents 9 through 29 years of age should get HPV vaccine as a two-dose series with the doses separated by 6-12 months. People who start HPV vaccination at 15 years of age and older should get the vaccine as a three-dose series with the  second dose given 1-2 months after the first dose and the third dose given 6 months after the first dose. There are several exceptions to these age recommendations. Your health care provider can give you more information.           3. Some people should not get this vaccine:    . Anyone who has had a severe (life-threatening) allergic reaction to a dose of HPV vaccine should not get another dose.     . Anyone who has a severe (life threatening) allergy to any component of HPV vaccine should not get the vaccine.      Tell your doctor if you have any severe allergies that you know of, including a severe allergy to yeast.    . HPV vaccine is not recommended for pregnant women. If you learn that you were pregnant when you were vaccinated, there is no reason to expect any problems for you or your baby. Any woman who learns she was pregnant when she got HPV vaccine is encouraged to contact the manufacturer's registry for HPV vaccination during pregnancy at 1-800-986-8999. Women who are breastfeeding may be vaccinated.     . If you have a mild illness, such as a cold, you can probably get the vaccine today. If you are moderately or severely ill, you should probably wait until you recover. Your doctor can   advise you.      4. Risks of a vaccine reaction    With any medicine, including vaccines, there is a chance of side effects. These are usually mild and go away on their own, but serious reactions are also possible.     Most people who get HPV vaccine do not have any serious problems with it.       Mild or moderate problems following HPV vaccine:    . Reactions in the arm where the shot was given:  - Soreness (about 9 people in 10)  - Redness or swelling (about 1 person in 3)    . Fever:  - Mild (100F) (about 1 person in 10)  - Moderate (102F) (about 1 person in 65)    . Other problems:  - Headache (about 1 person in 3)    Problems that could happen after any injected vaccine:    . People sometimes faint after a medical  procedure, including vaccination. Sitting or lying down for about 15 minutes can help prevent fainting and injuries caused by a fall. Tell your doctor if you feel dizzy, or have vision changes or ringing in the ears.    . Some people get severe pain in the shoulder and have difficulty moving the arm where a shot was given. This happens very rarely.    . Any medication can cause a severe allergic reaction. Such reactions from a vaccine are very rare, estimated at about 1 in a million doses, and would happen within a few minutes to a few hours after the vaccination.     As with any medicine, there is a very remote chance of a vaccine causing a serious injury or death.    The safety of vaccines is always being monitored.  For more information, visit: www.cdc.gov/vaccinesafety/.      5. What if there is a serious reaction?    What should I look for?    Look for anything that concerns you, such as signs of a severe allergic reaction, very high fever, or unusual behavior.    Signs of a severe allergic reaction can include hives, swelling of the face and throat, difficulty breathing, a fast heartbeat, dizziness, and weakness. These would usually start a few minutes to a few hours after the vaccination.    What should I do?    If you think it is a severe allergic reaction or other emergency that can't wait, call 9-1-1 or get to the nearest hospital. Otherwise, call your doctor.    Afterward, the reaction should be reported to the Vaccine Adverse Event Reporting System (VAERS). Your doctor should file this report, or you can do it yourself through the VAERS web site at www.vaers.hhs.gov, or by calling 1-800-822-7967.    VAERS does not give medical advice.      6. The National Vaccine Injury Compensation Program    The National Vaccine Injury Compensation Program (VICP) is a federal program that was created to compensate people who may have been injured by certain vaccines.    Persons who believe they may have been injured by  a vaccine can learn about the program and about filing a claim by calling 1-800-338-2382 or visiting the VICP website at www.hrsa.gov/vaccinecompensation. There is a time limit to file a claim for compensation.      7. How can I learn more?    . Ask your health care provider.  He or she can give you the vaccine package insert or suggest   other sources of information.  . Call your local or state health department.  . Contact the Centers for Disease Control and Prevention (CDC):  - Call 1-800-232-4636 (1-800-CDC-INFO) or  - Visit CDC's website at www.cdc.gov/hpv    Vaccine Information Statement   HPV Vaccine 09/10/2015  42 U.S.C.  [id number]    Department of Health and Human Services  Centers for Disease Control and Prevention    Office Use Only

## 2018-08-22 ENCOUNTER — Encounter: Payer: Self-pay | Admitting: Family Medicine

## 2018-08-22 DIAGNOSIS — J342 Deviated nasal septum: Secondary | ICD-10-CM | POA: Insufficient documentation

## 2018-08-22 DIAGNOSIS — F411 Generalized anxiety disorder: Secondary | ICD-10-CM | POA: Insufficient documentation

## 2018-08-22 DIAGNOSIS — F422 Mixed obsessional thoughts and acts: Secondary | ICD-10-CM | POA: Insufficient documentation

## 2018-09-26 ENCOUNTER — Encounter: Payer: Self-pay | Admitting: Family Medicine

## 2018-09-27 ENCOUNTER — Ambulatory Visit: Payer: Self-pay | Admitting: Physician Assistant

## 2018-09-28 ENCOUNTER — Encounter: Payer: Self-pay | Admitting: Family Medicine

## 2018-09-28 ENCOUNTER — Ambulatory Visit (INDEPENDENT_AMBULATORY_CARE_PROVIDER_SITE_OTHER): Payer: No Typology Code available for payment source | Admitting: Family Medicine

## 2018-09-28 ENCOUNTER — Encounter: Payer: Self-pay | Admitting: Emergency Medicine

## 2018-09-28 VITALS — BP 182/114 | HR 130 | Temp 101.3°F | Resp 16 | Wt 218.0 lb

## 2018-09-28 DIAGNOSIS — J029 Acute pharyngitis, unspecified: Secondary | ICD-10-CM | POA: Diagnosis not present

## 2018-09-28 DIAGNOSIS — R509 Fever, unspecified: Secondary | ICD-10-CM

## 2018-09-28 LAB — POC INFLUENZA A&B (BINAX/QUICKVUE)
INFLUENZA A, POC: NEGATIVE
INFLUENZA B, POC: NEGATIVE

## 2018-09-28 LAB — POCT RAPID STREP A (OFFICE): Rapid Strep A Screen: NEGATIVE

## 2018-09-28 NOTE — Assessment & Plan Note (Signed)
Testing for flu and rapid strep were normal. Likely has flu given exposure  - counseled on supportive care - given work note for today and tomorrow  - given indications to follow up.

## 2018-09-28 NOTE — Progress Notes (Signed)
Jennifer HandlerXanina Dukes - 27 y.o. female MRN [id number]  Date of birth: [birth year]  SUBJECTIVE:  Including CC & ROS.  No chief complaint on file.   Jennifer Solis is a 27 y.o. female that is presenting with fever and cough.  Her symptoms started on Wednesday.  She works in a pediatric emergency department.  Several people have been in with the flu and strep throat.  She is having a cough, fever of 102, and body aches.  Has been using a Profen and Tylenol.  Her symptoms have continued with little improvement.  She did receive the flu vaccine.   Review of Systems  Constitutional: Positive for fever.  HENT: Positive for sore throat.   Respiratory: Positive for cough.   Cardiovascular: Negative for chest pain.  Gastrointestinal: Negative for abdominal distention.  Musculoskeletal: Negative for back pain.    HISTORY: Past Medical, Surgical, Social, and Family History Reviewed & Updated per EMR.   Pertinent Historical Findings include:  Past Medical History:  Diagnosis Date  . Migraines   . Polycystic ovarian syndrome     Past Surgical History:  Procedure Laterality Date  . KNEE SURGERY Left   . RHINOPLASTY  2011    Allergies  Allergen Reactions  . Acetaminophen Other (See Comments)    paralysis  . Augmentin [Amoxicillin-Pot Clavulanate] Nausea And Vomiting    No family history on file.   Social History   Socioeconomic History  . Marital status: Single    Spouse name: Not on file  . Number of children: Not on file  . Years of education: Not on file  . Highest education level: Not on file  Occupational History  . Not on file  Social Needs  . Financial resource strain: Not on file  . Food insecurity:    Worry: Not on file    Inability: Not on file  . Transportation needs:    Medical: Not on file    Non-medical: Not on file  Tobacco Use  . Smoking status: Never Smoker  . Smokeless tobacco: Never Used  Substance and Sexual Activity  . Alcohol use: No  . Drug use: Never  .  Sexual activity: Not on file  Lifestyle  . Physical activity:    Days per week: Not on file    Minutes per session: Not on file  . Stress: Not on file  Relationships  . Social connections:    Talks on phone: Not on file    Gets together: Not on file    Attends religious service: Not on file    Active member of club or organization: Not on file    Attends meetings of clubs or organizations: Not on file    Relationship status: Not on file  . Intimate partner violence:    Fear of current or ex partner: Not on file    Emotionally abused: Not on file    Physically abused: Not on file    Forced sexual activity: Not on file  Other Topics Concern  . Not on file  Social History Narrative  . Not on file     PHYSICAL EXAM:  VS: BP (!) 182/114   Pulse (!) 130   Temp (!) 101.3 F (38.5 C) (Oral)   Resp 16   Wt 218 lb (98.9 kg)   SpO2 98%   BMI 37.42 kg/m  Physical Exam Gen: NAD, alert, cooperative with exam,  ENT: normal lips, normal nasal mucosa, tympanic membranes clear and intact bilaterally, normal oropharynx, no cervical  lymphadenopathy Eye: normal EOM, normal conjunctiva and lids CV:  no edema, +2 pedal pulses, regular rate and rhythm, S1-S2   Resp: no accessory muscle use, non-labored, clear to auscultation bilaterally, no crackles or wheezes Skin: no rashes, no areas of induration  Neuro: normal tone, normal sensation to touch Psych:  normal insight, alert and oriented MSK: Normal gait, normal strength       ASSESSMENT & PLAN:   Fever and chills Testing for flu and rapid strep were normal. Likely has flu given exposure  - counseled on supportive care - given work note for today and tomorrow  - given indications to follow up.

## 2018-09-28 NOTE — Patient Instructions (Signed)
Nice to meet you  Please try to stay well hydrated  Get plenty of sleep  Please alternate tylenol and ibuprofen  Please follow up if your fever continues to be elevated  Happy Holidays!

## 2018-10-01 ENCOUNTER — Encounter: Payer: Self-pay | Admitting: Physician Assistant

## 2018-10-01 ENCOUNTER — Ambulatory Visit (INDEPENDENT_AMBULATORY_CARE_PROVIDER_SITE_OTHER): Payer: No Typology Code available for payment source | Admitting: Physician Assistant

## 2018-10-01 VITALS — BP 150/100 | HR 102 | Temp 98.9°F | Ht 64.0 in | Wt 216.2 lb

## 2018-10-01 DIAGNOSIS — R05 Cough: Secondary | ICD-10-CM | POA: Diagnosis not present

## 2018-10-01 DIAGNOSIS — R059 Cough, unspecified: Secondary | ICD-10-CM

## 2018-10-01 MED ORDER — METHYLPREDNISOLONE ACETATE 80 MG/ML IJ SUSP
80.0000 mg | Freq: Once | INTRAMUSCULAR | Status: AC
  Administered 2018-10-01: 80 mg via INTRAMUSCULAR

## 2018-10-01 MED ORDER — HYDROCOD POLST-CPM POLST ER 10-8 MG/5ML PO SUER: 5.0000 mL | Freq: Every evening | ORAL | 0 refills | Status: DC | PRN

## 2018-10-01 MED ORDER — AZITHROMYCIN 250 MG PO TABS: ORAL_TABLET | ORAL | 0 refills | Status: DC

## 2018-10-01 MED FILL — HYDROCODONE-CHLORPHEN ER SU: 10-8 | 28 days supply | Qty: 140 | Fill #0

## 2018-10-01 MED FILL — AZITHROMYCIN 250 MG TABLET: 250 | 5 days supply | Qty: 6 | Fill #0

## 2018-10-01 NOTE — Patient Instructions (Signed)
It was great to see you!  Start oral azithromycin antibiotic.  Use tussionex cough syrup as needed at night (may make drowsy)  Push fluids and get plenty of rest. Please return if you are not improving as expected, or if you have high fevers (>101.5) or difficulty swallowing or worsening productive cough.  Call clinic with questions.  I hope you start feeling better soon!

## 2018-10-01 NOTE — Progress Notes (Signed)
Jennifer Solis is a 27 y.o. female here for a new problem.  I acted as a Neurosurgeonscribe for Energy East CorporationSamantha Carmisha Larusso, PA-C Corky Mullonna Orphanos, LPN  History of Present Illness:   Chief Complaint  Patient presents with  . Cough    Cough  This is a new problem. Episode onset: Started 3 weeks ago, [facility] and was tested for strep-Negative and Flu was negative and was not given anything. The problem has been gradually worsening. The cough is non-productive (worse at night unable to sleep). Associated symptoms include headaches, nasal congestion, a sore throat and shortness of breath. Pertinent negatives include no chills, fever or postnasal drip. Associated symptoms comments: Chest congestion- dry cough. The symptoms are aggravated by lying down. She has tried OTC cough suppressant (Delsyym, Mucinex DM, Thera flu, Nyquil) for the symptoms. The treatment provided no relief. Her past medical history is significant for bronchitis and pneumonia.   Denies chest pain.   Past Medical History:  Diagnosis Date  . Migraines   . Polycystic ovarian syndrome      Social History   Socioeconomic History  . Marital status: Single    Spouse name: Not on file  . Number of children: Not on file  . Years of education: Not on file  . Highest education level: Not on file  Occupational History  . Not on file  Social Needs  . Financial resource strain: Not on file  . Food insecurity:    Worry: Not on file    Inability: Not on file  . Transportation needs:    Medical: Not on file    Non-medical: Not on file  Tobacco Use  . Smoking status: Never Smoker  . Smokeless tobacco: Never Used  Substance and Sexual Activity  . Alcohol use: No  . Drug use: Never  . Sexual activity: Not on file  Lifestyle  . Physical activity:    Days per week: Not on file    Minutes per session: Not on file  . Stress: Not on file  Relationships  . Social connections:    Talks on phone: Not on file    Gets together: Not  on file    Attends religious service: Not on file    Active member of club or organization: Not on file    Attends meetings of clubs or organizations: Not on file    Relationship status: Not on file  . Intimate partner violence:    Fear of current or ex partner: Not on file    Emotionally abused: Not on file    Physically abused: Not on file    Forced sexual activity: Not on file  Other Topics Concern  . Not on file  Social History Narrative  . Not on file    Past Surgical History:  Procedure Laterality Date  . KNEE SURGERY Left   . RHINOPLASTY  2011    History reviewed. No pertinent family history.  Allergies  Allergen Reactions  . Acetaminophen Other (See Comments)    paralysis  . Augmentin [Amoxicillin-Pot Clavulanate] Nausea And Vomiting    Current Medications:   Current Outpatient Medications:  .  guanFACINE (INTUNIV) 1 MG TB24 ER tablet, Take 1 tablet by mouth daily., Disp: , Rfl: 0 .  levocetirizine (XYZAL) 5 MG tablet, Take 5 mg by mouth every evening., Disp: , Rfl:  .  Multiple Vitamins-Minerals (MULTIVITAMIN ADULT PO), Take 1 tablet by mouth daily., Disp: , Rfl:  .  spironolactone (ALDACTONE) 50 MG  tablet, Take 1 tablet (50 mg total) by mouth daily., Disp: 90 tablet, Rfl: 2 .  azithromycin (ZITHROMAX) 250 MG tablet, Take two tablets on day 1, then one tablet daily x 4 days, Disp: 6 tablet, Rfl: 0 .  chlorpheniramine-HYDROcodone (TUSSIONEX PENNKINETIC ER) 10-8 MG/5ML SUER, Take 5 mLs by mouth at bedtime as needed for cough., Disp: 140 mL, Rfl: 0  Current Facility-Administered Medications:  .  methylPREDNISolone acetate (DEPO-MEDROL) injection 80 mg, 80 mg, Intramuscular, Once, [place], [place], PA   Review of Systems:   Review of Systems  Constitutional: Negative for chills and fever.  HENT: Positive for sore throat. Negative for postnasal drip.   Respiratory: Positive for cough and shortness of breath.   Neurological: Positive for headaches.    Vitals:    Vitals:   10/01/18 1040  BP: (!) 150/100  Pulse: (!) 102  Temp: 98.9 F (37.2 C)  TempSrc: Oral  SpO2: 97%  Weight: 216 lb 4 oz (98.1 kg)  Height: 5\' 4"  (1.626 m)     Body mass index is 37.12 kg/m.  Physical Exam:   Physical Exam Vitals signs and nursing note reviewed.  Constitutional:      General: She is not in acute distress.    Appearance: She is well-developed. She is not ill-appearing or toxic-appearing.  HENT:     Head: Normocephalic and atraumatic.     Right Ear: Tympanic membrane, ear canal and external ear normal. Tympanic membrane is not erythematous, retracted or bulging.     Left Ear: Tympanic membrane, ear canal and external ear normal. Tympanic membrane is not erythematous, retracted or bulging.     Nose: Nose normal.     Right Sinus: No maxillary sinus tenderness or frontal sinus tenderness.     Left Sinus: No maxillary sinus tenderness or frontal sinus tenderness.     Mouth/Throat:     Pharynx: Uvula midline. Posterior oropharyngeal erythema present.     Tonsils: No tonsillar exudate. Swelling: 2+ on the right. 2+ on the left.  Eyes:     General: Lids are normal.     Conjunctiva/sclera: Conjunctivae normal.  Neck:     Trachea: Trachea normal.  Cardiovascular:     Rate and Rhythm: Normal rate and regular rhythm.     Heart sounds: Normal heart sounds, S1 normal and S2 normal.  Pulmonary:     Effort: Pulmonary effort is normal.     Breath sounds: Examination of the right-lower field reveals decreased breath sounds. Examination of the left-lower field reveals decreased breath sounds. Decreased breath sounds present. No wheezing, rhonchi or rales.  Lymphadenopathy:     Cervical: No cervical adenopathy.  Skin:    General: Skin is warm and dry.  Neurological:     Mental Status: She is alert.  Psychiatric:        Speech: Speech normal.        Behavior: Behavior normal. Behavior is cooperative.      Assessment and Plan:   Jennifer Solis was seen today for  cough.  Diagnoses and all orders for this visit:  Cough -     methylPREDNISolone acetate (DEPO-MEDROL) injection 80 mg  Other orders -     azithromycin (ZITHROMAX) 250 MG tablet; Take two tablets on day 1, then one tablet daily x 4 days -     chlorpheniramine-HYDROcodone (TUSSIONEX PENNKINETIC ER) 10-8 MG/5ML SUER; Take 5 mLs by mouth at bedtime as needed for cough.   No red flags on exam. States that her blood pressure  is normally high on exam in the office. Denies CP. No obvious work of breathing on exam. Will initiate azithromycin and tussionex cough syrup.  Received depo-medrol injection in the office and tolerated well.  Discussed taking medications as prescribed. Reviewed return precautions including worsening fever, SOB, worsening cough or other concerns. Push fluids and rest. I recommend that patient follow-up if symptoms worsen or persist despite treatment x 7-10 days, sooner if needed.  . Reviewed expectations re: course of current medical issues. . Discussed self-management of symptoms. . Outlined signs and symptoms indicating need for more acute intervention. . Patient verbalized understanding and all questions were answered. . See orders for this visit as documented in the electronic medical record. . Patient received an After-Visit Summary.  CMA or LPN served as scribe during this visit. History, Physical, and Plan performed by medical provider. The above documentation has been reviewed and is accurate and complete.  Jarold MottoSamantha Raylan Hanton, PA-C

## 2018-10-11 DIAGNOSIS — R41840 Attention and concentration deficit: Secondary | ICD-10-CM | POA: Insufficient documentation

## 2018-10-17 MED FILL — SERTRALINE HCL 50 MG TABLET: 50 | 30 days supply | Qty: 30 | Fill #0

## 2018-10-22 ENCOUNTER — Ambulatory Visit (INDEPENDENT_AMBULATORY_CARE_PROVIDER_SITE_OTHER): Payer: No Typology Code available for payment source

## 2018-10-22 DIAGNOSIS — Z23 Encounter for immunization: Secondary | ICD-10-CM | POA: Diagnosis not present

## 2018-10-22 DIAGNOSIS — R110 Nausea: Secondary | ICD-10-CM | POA: Diagnosis not present

## 2018-10-22 MED ORDER — ONDANSETRON HCL 4 MG PO TABS: 4.0000 mg | ORAL_TABLET | Freq: Once | ORAL | Status: DC

## 2018-10-22 MED ORDER — ONDANSETRON 4 MG PO TBDP
4.0000 mg | ORAL_TABLET | Freq: Once | ORAL | Status: AC
  Administered 2018-10-22: 4 mg via ORAL

## 2018-10-22 NOTE — Progress Notes (Signed)
Per orders of Dr. Earlene PlaterWallace, injection of #2 HPV given by Donnamarie PoagJoellen Y Thompson in left deltoid . Patient tolerated injection well. Patient had nausea after last HPV verbal order given by Dr. Earlene PlaterWallace for ondansetron 4mg  while in office. Administered to with no problems. She has had in the past. Patient informed will need last HPV on or after May 12

## 2018-11-21 MED FILL — AZITHROMYCIN 250 MG TABLET: 250 | 5 days supply | Qty: 6 | Fill #0

## 2018-12-04 MED FILL — SERTRALINE HCL 100 MG TAB: 100 | 30 days supply | Qty: 30 | Fill #0 | Status: TO

## 2018-12-05 MED FILL — SPIRONOLACTONE 50 MG TABLET: 50 | 90 days supply | Qty: 90 | Fill #1

## 2019-01-14 MED FILL — SERTRALINE HCL 100 MG TAB: 100 | 30 days supply | Qty: 30 | Fill #0

## 2019-01-20 ENCOUNTER — Emergency Department (HOSPITAL_COMMUNITY)
Admission: EM | Admit: 2019-01-20 | Discharge: 2019-01-21 | Disposition: A | Discharge status: Home / Self Care | Payer: No Typology Code available for payment source | Attending: Emergency Medicine | Admitting: Emergency Medicine

## 2019-01-20 ENCOUNTER — Emergency Department (HOSPITAL_COMMUNITY): Payer: No Typology Code available for payment source

## 2019-01-20 ENCOUNTER — Encounter (HOSPITAL_COMMUNITY): Payer: Self-pay | Admitting: Emergency Medicine

## 2019-01-20 ENCOUNTER — Ambulatory Visit: Payer: Self-pay | Admitting: *Deleted

## 2019-01-20 DIAGNOSIS — R05 Cough: Secondary | ICD-10-CM | POA: Diagnosis not present

## 2019-01-20 DIAGNOSIS — Z20828 Contact with and (suspected) exposure to other viral communicable diseases: Secondary | ICD-10-CM | POA: Insufficient documentation

## 2019-01-20 DIAGNOSIS — M791 Myalgia, unspecified site: Secondary | ICD-10-CM | POA: Diagnosis not present

## 2019-01-20 DIAGNOSIS — R509 Fever, unspecified: Secondary | ICD-10-CM | POA: Insufficient documentation

## 2019-01-20 DIAGNOSIS — E860 Dehydration: Secondary | ICD-10-CM | POA: Diagnosis not present

## 2019-01-20 DIAGNOSIS — Z79899 Other long term (current) drug therapy: Secondary | ICD-10-CM | POA: Diagnosis not present

## 2019-01-20 DIAGNOSIS — R51 Headache: Secondary | ICD-10-CM | POA: Diagnosis not present

## 2019-01-20 DIAGNOSIS — R6889 Other general symptoms and signs: Secondary | ICD-10-CM

## 2019-01-20 DIAGNOSIS — Z20822 Contact with and (suspected) exposure to covid-19: Secondary | ICD-10-CM

## 2019-01-20 LAB — COMPREHENSIVE METABOLIC PANEL
ALT: 40 U/L (ref 0–44)
AST: 24 U/L (ref 15–41)
Albumin: 4.6 g/dL (ref 3.5–5.0)
Alkaline Phosphatase: 57 U/L (ref 38–126)
Anion gap: 12 (ref 5–15)
BUN: 11 mg/dL (ref 6–20)
CO2: 24 mmol/L (ref 22–32)
Calcium: 9 mg/dL (ref 8.9–10.3)
Chloride: 100 mmol/L (ref 98–111)
Creatinine, Ser: 0.87 mg/dL (ref 0.44–1.00)
GFR calc Af Amer: >60 mL/min (ref >60)
GFR calc non Af Amer: >60 mL/min (ref >60)
Glucose, Bld: 132 mg/dL — ABNORMAL HIGH (ref 70–99)
Potassium: 3.5 mmol/L (ref 3.5–5.1)
Sodium: 136 mmol/L (ref 135–145)
Total Bilirubin: 0.4 mg/dL (ref 0.3–1.2)
Total Protein: 8.4 g/dL — ABNORMAL HIGH (ref 6.5–8.1)

## 2019-01-20 LAB — CBC WITH DIFFERENTIAL/PLATELET
Abs Immature Granulocytes: 0.03 10*3/uL (ref 0.00–0.07)
Basophils Absolute: 0 10*3/uL (ref 0.0–0.1)
Basophils Relative: 0 %
Eosinophils Absolute: 0.3 10*3/uL (ref 0.0–0.5)
Eosinophils Relative: 3 %
HCT: 45.8 % (ref 36.0–46.0)
Hemoglobin: 15.2 g/dL — ABNORMAL HIGH (ref 12.0–15.0)
Immature Granulocytes: 0 %
Lymphocytes Relative: 14 %
Lymphs Abs: 1.5 10*3/uL (ref 0.7–4.0)
MCH: 30.9 pg (ref 26.0–34.0)
MCHC: 33.2 g/dL (ref 30.0–36.0)
MCV: 93.1 fL (ref 80.0–100.0)
Monocytes Absolute: 0.6 10*3/uL (ref 0.1–1.0)
Monocytes Relative: 6 %
Neutro Abs: 8.1 10*3/uL — ABNORMAL HIGH (ref 1.7–7.7)
Neutrophils Relative %: 77 %
Platelets: 334 10*3/uL (ref 150–400)
RBC: 4.92 MIL/uL (ref 3.87–5.11)
RDW: 12.7 % (ref 11.5–15.5)
WBC: 10.5 10*3/uL (ref 4.0–10.5)
nRBC: 0 % (ref 0.0–0.2)

## 2019-01-20 LAB — LACTIC ACID, PLASMA
Lactic Acid, Venous: 2 mmol/L (ref 0.5–1.9)
Lactic Acid, Venous: 1.3 mmol/L (ref 0.5–1.9)

## 2019-01-20 LAB — I-STAT BETA HCG BLOOD, ED (MC, WL, AP ONLY): I-stat hCG, quantitative: <5 m[IU]/mL (ref <5)

## 2019-01-20 MED ORDER — SODIUM CHLORIDE 0.9 % IV BOLUS
1000.0000 mL | Freq: Once | INTRAVENOUS | Status: AC
  Administered 2019-01-20: 1000 mL via INTRAVENOUS

## 2019-01-20 MED ORDER — ONDANSETRON HCL 4 MG/2ML IJ SOLN
4.0000 mg | Freq: Once | INTRAMUSCULAR | Status: AC
  Administered 2019-01-20: 4 mg via INTRAVENOUS
  Filled 2019-01-20: qty 2

## 2019-01-20 MED ORDER — ONDANSETRON 4 MG PO TBDP: 4.0000 mg | ORAL_TABLET | Freq: Three times a day (TID) | ORAL | 0 refills | Status: DC | PRN

## 2019-01-20 MED ORDER — PROCHLORPERAZINE EDISYLATE 10 MG/2ML IJ SOLN
10.0000 mg | Freq: Once | INTRAMUSCULAR | Status: AC
  Administered 2019-01-20: 10 mg via INTRAVENOUS
  Filled 2019-01-20: qty 2

## 2019-01-20 MED ORDER — CYCLOBENZAPRINE HCL 10 MG PO TABS
10.0000 mg | ORAL_TABLET | Freq: Once | ORAL | Status: AC
  Administered 2019-01-20: 10 mg via ORAL
  Filled 2019-01-20: qty 1

## 2019-01-20 MED ORDER — DIPHENHYDRAMINE HCL 50 MG/ML IJ SOLN
25.0000 mg | Freq: Once | INTRAMUSCULAR | Status: AC
  Administered 2019-01-20: 25 mg via INTRAVENOUS
  Filled 2019-01-20: qty 1

## 2019-01-20 MED ORDER — CYCLOBENZAPRINE HCL 10 MG PO TABS: 10.0000 mg | ORAL_TABLET | Freq: Two times a day (BID) | ORAL | 0 refills | Status: DC | PRN

## 2019-01-20 NOTE — ED Triage Notes (Signed)
Pt comes to ed via POV, pt works in [employer] system, developed a non productive cough 4 days ago. Pt verbalizes she woke up this morning with a fever, nausea, vomiting, and a persistent headache for a week now. Fever, N/V only started today however.  Pt states she is originally from [place] and is not use to the [place] pollen count. Pt had two episodes of vomiting today.  Pt has indirect and direct care with covid floor at [facility] yesterday,Pts home temp was 101.4 Pt alert x4. Other medical history includes migraines.

## 2019-01-20 NOTE — Telephone Encounter (Signed)
Pt's father calling initially, on DPR, triage nurse spoke with pt.  Reports temp 100.4 this am , presently 101.4 Reports 8-9/10 headache "Not like my migraines" States "Like a vise from ear to ear and the top of my head."  Also reports "Stiff, painful neck" States can not bend chin to chest. Reports 2 episodes of vomiting earlier today. Is not staying hydrated, "Drank 3oz gatorade" States voided x 1 all day. Reports "Bad" body aches, lightheaded. 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 States dry cough, onset 2 days ago. Mild SOB. Pt directed to ED. [place] called [facility], spoke with Lequita Halt, to alert of pending arrival. Pt's father will drive, states they have been self isolating, he has been only caregiver. Pt verbalizes understanding of ED process.  Reason for Disposition . Patient sounds very sick or weak to the triager  Answer Assessment - Initial Assessment Questions 1. COVID-19 DIAGNOSIS: "Who made your Coronavirus (COVID-19) diagnosis?" "Was it confirmed by a positive lab test?" If not diagnosed by a HCP, ask "Are there lots of cases (community spread) where you live?" (See public health department website, if unsure)   * MAJOR community spread: high number of cases; numbers of cases are increasing; many people hospitalized.   * MINOR community spread: low number of cases; not increasing; few or no people hospitalized     N/A 2. ONSET: "When did the COVID-19 symptoms start?"      Fever cough vomiting this AM 3. WORST SYMPTOM: "What is your worst symptom?" (e.g., cough, fever, shortness of breath, muscle aches)    Body aches, neck pain, headache  fever 101.4  4. COUGH: "How bad is the cough?"      "Bad"  Moderate 5. FEVER: "Do you have a fever?" If so, ask: "What is your temperature, how was it measured, and when did it start?"     101.4 prior to call 100.4 this am. oral 6. RESPIRATORY STATUS: "Describe your  breathing?" (e.g., shortness of breath, wheezing, unable to speak)      Mild SOB 7. BETTER-SAME-WORSE: "Are you getting better, staying the same or getting worse compared to yesterday?"  If getting worse, ask, "In what way?"     worse 8. HIGH RISK DISEASE: "Do you have any chronic medical problems?" (e.g., asthma, heart or lung disease, weak immune system, etc.)      9. PREGNANCY: "Is there any chance you are pregnant?" "When was your last menstrual period?"      10. OTHER SYMPTOMS: "Do you have any other symptoms?"  (e.g., runny nose, headache, sore throat, loss of smell)     Stiff neck , can not bend to chin, painful, stiff  Protocols used: CORONAVIRUS (COVID-19) DIAGNOSED OR SUSPECTED-A-AH

## 2019-01-20 NOTE — Discharge Instructions (Signed)
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  I will be thinking of you and wishing you a speedy recovery. 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  If your test is negative, per the ID doctor, you can return to work as the sensitivity in the first week of symptoms is very good.  IF you feel worse, please return to the ED for repeat evaluation.  Take Care!!

## 2019-01-20 NOTE — ED Provider Notes (Signed)
[facility]-EMERGENCY DEPT Provider Note   CSN: [id number] Arrival date & time: 01/20/19  1910    History   Chief Complaint Chief Complaint  Patient presents with  . Neck Pain  . Fever  . Tachycardia  . Nausea  . Cough    HPI Jennifer Solis is a 28 y.o. female.     HPI   28yo female presents with fever Neck pain 3-4 days ago This AM could not move it up or down. Normally temp is 96.8, was 100.7 and went to 101.4 at home today, took 2 motrin at 545PM which brought temp down. Has not been able to eat.  Thrown up twice.  Feel short of breath.  Severe body aches.  Thinks had 1-2 pt with bacterial meningitis about 1 week ago that she saw briefly in triage, and a few with COVID rule out.  Not sure what kind of meningitis. Floated to COVID floor. Cough started 4 days ago, not really productive, worse at night.  Had pneumonia back in December but this feels different Diarrhea yesterday and day before but nothing today but has not had food.  No chest pain Headache for about 1 week, worse this AM.  Hx of migraines but doesn't feel like this. No sore throat.  Not sure if loss of smell or taste.  Congestion has been present in the last 5 days.   Past Medical History:  Diagnosis Date  . Migraines   . Polycystic ovarian syndrome   . TBI (traumatic brain injury) (HCC) 2012   with skull fracture after fall     Patient Active Problem List   Diagnosis Date Noted  . Attention or concentration deficit 10/11/2018  . Fever and chills 09/28/2018  . Deviated septum 08/22/2018  . Mixed obsessional thoughts and acts 08/22/2018  . GAD (generalized anxiety disorder) 08/22/2018  . OCD (obsessive compulsive disorder) 08/20/2018  . ADHD 08/20/2018  . IBS (irritable bowel syndrome) - D 05/14/2018  . Allergic rhinitis 12/27/2015  . Anxiety 12/27/2015  . Depression 12/27/2015  . GERD (gastroesophageal reflux disease) 12/27/2015  . Hyperlipidemia 12/27/2015  . Migraine  12/27/2015  . PCOS (polycystic ovarian syndrome) 12/27/2015    Past Surgical History:  Procedure Laterality Date  . KNEE SURGERY Left   . RHINOPLASTY  2011     OB History   No obstetric history on file.      Home Medications    Prior to Admission medications   Medication Sig Start Date End Date Taking? Authorizing Provider  levocetirizine (XYZAL) 5 MG tablet Take 5 mg by mouth every evening.   Yes [provider]  Multiple Vitamins-Minerals (MULTIVITAMIN ADULT PO) Take 1 tablet by mouth daily.   Yes [provider]  sertraline (ZOLOFT) 100 MG tablet Take 100 mg by mouth at bedtime.  01/14/19  Yes [provider]  cyclobenzaprine (FLEXERIL) 10 MG tablet Take 1 tablet (10 mg total) by mouth 2 (two) times daily as needed for muscle spasms. 01/20/19   Alvira Monday, MD  ondansetron (ZOFRAN ODT) 4 MG disintegrating tablet Take 1 tablet (4 mg total) by mouth every 8 (eight) hours as needed for nausea or vomiting. 01/20/19   Alvira Monday, MD    Family History No family history on file.  Social History Social History   Tobacco Use  . Smoking status: Never Smoker  . Smokeless tobacco: Never Used  Substance Use Topics  . Alcohol use: No  . Drug use: Never  Allergies   Acetaminophen and Augmentin [amoxicillin-pot clavulanate]   Review of Systems Review of Systems  Constitutional: Positive for appetite change, fatigue and fever.  HENT: Positive for congestion. Negative for sore throat.   Eyes: Negative for visual disturbance.  Respiratory: Positive for cough and shortness of breath.   Cardiovascular: Negative for chest pain and leg swelling.  Gastrointestinal: Positive for diarrhea, nausea and vomiting. Negative for abdominal pain.  Genitourinary: Negative for difficulty urinating and dysuria.  Musculoskeletal: Positive for arthralgias, myalgias and neck pain. Negative for back pain.  Skin: Negative for rash.  Neurological: Positive for  headaches. Negative for syncope, facial asymmetry, weakness and numbness.     Physical Exam Updated Vital Signs BP (!) 148/108 (BP Location: Left Arm)   Pulse (!) 101   Temp 98 F (36.7 C) (Oral)   Resp 17   Ht  (1.626 m)   Wt 90.7 kg   SpO2 94%   BMI 34.33 kg/m   Physical Exam Vitals signs and nursing note reviewed.  Constitutional:      General: She is not in acute distress.    Appearance: She is well-developed. She is not diaphoretic.  HENT:     Head: Normocephalic and atraumatic.  Eyes:     Conjunctiva/sclera: Conjunctivae normal.  Neck:     Musculoskeletal: Normal range of motion. No neck rigidity.     Comments: Reports pain after movements, however no rigidity, negative Kernig's and Brudzinski's Cardiovascular:     Rate and Rhythm: Regular rhythm. Tachycardia present.     Heart sounds: Normal heart sounds. No murmur. No friction rub. No gallop.   Pulmonary:     Effort: Pulmonary effort is normal. No respiratory distress.     Breath sounds: Normal breath sounds. No wheezing or rales.  Abdominal:     General: There is no distension.     Palpations: Abdomen is soft.     Tenderness: There is no abdominal tenderness. There is no guarding.  Musculoskeletal:        General: No tenderness.  Skin:    General: Skin is warm and dry.     Findings: No erythema or rash.  Neurological:     Mental Status: She is alert and oriented to person, place, and time.     GCS: GCS eye subscore is 4. GCS verbal subscore is 5. GCS motor subscore is 6.     Cranial Nerves: No cranial nerve deficit, dysarthria or facial asymmetry.     Sensory: Sensation is intact. No sensory deficit.     Motor: Motor function is intact. No weakness or pronator drift.     Coordination: Coordination normal. Finger-Nose-Finger Test normal.      ED Treatments / Results  Labs (all labs ordered are listed, but only abnormal results are displayed) Labs Reviewed  CBC WITH DIFFERENTIAL/PLATELET -  Abnormal; Notable for the following components:      Result Value   Hemoglobin 15.2 (*)    Neutro Abs 8.1 (*)    All other components within normal limits  COMPREHENSIVE METABOLIC PANEL - Abnormal; Notable for the following components:   Glucose, Bld 132 (*)    Total Protein 8.4 (*)    All other components within normal limits  LACTIC ACID, PLASMA - Abnormal; Notable for the following components:   Lactic Acid, Venous 2.0 (*)    All other components within normal limits  SARS CORONAVIRUS 2 (HOSPITAL ORDER, PERFORMED IN [facility])  LACTIC ACID, PLASMA  I-STAT  BETA HCG BLOOD, ED ([facility], [facility], [facility] ONLY)    EKG None  Radiology Dg Chest Portable 1 View  Result Date: 01/20/2019 CLINICAL DATA:  Shortness of breath and cough EXAM: PORTABLE CHEST 1 VIEW COMPARISON:  None. FINDINGS: The heart size and mediastinal contours are within normal limits. Both lungs are clear. The visualized skeletal structures are unremarkable. IMPRESSION: No active disease. Electronically Signed   By: Deatra Robinson M.D.   On: 01/20/2019 20:54    Procedures Procedures (including critical care time)  Medications Ordered in ED Medications  ondansetron (ZOFRAN) injection 4 mg (4 mg Intravenous Given 01/20/19 2022)  cyclobenzaprine (FLEXERIL) tablet 10 mg (10 mg Oral Given 01/20/19 2021)  sodium chloride 0.9 % bolus 1,000 mL (0 mLs Intravenous Stopped 01/20/19 2221)  prochlorperazine (COMPAZINE) injection 10 mg (10 mg Intravenous Given 01/20/19 2221)  diphenhydrAMINE (BENADRYL) injection 25 mg (25 mg Intravenous Given 01/20/19 2220)     Initial Impression / Assessment and Plan / ED Course  I have reviewed the triage vital signs and the nursing notes.  Pertinent labs & imaging results that were available during my care of the patient were reviewed by me and considered in my medical decision making (see chart for details).        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  Has had headache for one week prior to development of fever in presence of multiple other symptoms, with negative Kernig's and Brudzinski's, no rigidity.  Given timing and combination of other symptoms with these physical exam findings I have a low suspicion for bacterial meningitis.   CXR shows no sign of pneumonia.  No urinary symptoms and doubt urinary source of constellation of symptoms described.    Labs within normal limits with exception of lactic acid of 2.  On presentation, initially was tachycardic, however likely in setting of dehydration from emesis and decreased po as well as anxiety of being a patient in the ED.  Heart rate improved with fluids to 80s on multiple reevaluations.  Blood pressures decreased however mildly elevated, which also may be situational at this time and should be rechecked as outpatient.   Repeat HR and labs improved, including normal repeat lactic acid. Her saturations remain above 94, she has no tachypnea, and feel she is appropriate for outpatient supportive care.  Given zofran, flexeril, compazine/benadryl and 1L NS.  High suspicion for COVID19 given exposures and constellation of symptoms. Testing completed after approval from ID, and per ID physician if negative she may return to work as able.  Discussed strict return precautions and strict quarantine.   Latigra Mikrut was evaluated in Emergency Department on 01/21/2019 for the symptoms described in the history of present illness. He/she was evaluated in the context of the global COVID-19 pandemic, which necessitated consideration that the patient might be at risk for infection with the SARS-CoV-2 virus that causes COVID-19. Institutional protocols and algorithms that pertain to the evaluation of patients at risk for COVID-19 are in a state of rapid  change based on information released by regulatory bodies including the CDC and federal and state organizations. These policies and algorithms were followed during the patient's care in the ED.   Final Clinical Impressions(s) / ED Diagnoses   Final diagnoses:  Suspected Covid-19 Virus Infection  ED Discharge Orders         Ordered    ondansetron (ZOFRAN ODT) 4 MG disintegrating tablet  Every 8 hours PRN     01/20/19 2301    cyclobenzaprine (FLEXERIL) 10 MG tablet  2 times daily PRN     01/20/19 2301           Alvira MondaySchlossman, Reshonda Koerber, MD 01/21/19 0020

## 2019-01-20 NOTE — ED Notes (Signed)
Bed: JQ30 Expected date:  Expected time:  Means of arrival:  Comments:  POV suspected COVID

## 2019-01-20 NOTE — ED Notes (Signed)
Date and time results received: 01/20/19 9:09 PM  (use smartphrase ".now" to insert current time)  Test: Lactic Acid Critical Value: 2.0  Name of Provider Notified:Dr. Dalene Seltzer  Orders Received? Or Actions Taken?:

## 2019-01-21 LAB — SARS CORONAVIRUS 2 BY RT PCR (HOSPITAL ORDER, PERFORMED IN ~~LOC~~ HOSPITAL LAB): SARS Coronavirus 2: NEGATIVE

## 2019-01-21 MED FILL — ONDANSETRON ODT 4 MG TABLET: 4 | 3 days supply | Qty: 20 | Fill #0

## 2019-01-21 MED FILL — CYCLOBENZAPRINE HCL 10 MG T: 10 | 10 days supply | Qty: 20 | Fill #0

## 2019-01-21 NOTE — ED Notes (Signed)
No nausea  Pain score 2 headache near gone  Neck stiffness better

## 2019-01-21 NOTE — Telephone Encounter (Signed)
Pt went to the ED, has been discharged/no admission.   Forwarding to Dr. Earlene Plater as Lorain Childes    Dx suspected Covid-19

## 2019-01-21 NOTE — Telephone Encounter (Signed)
See note

## 2019-01-22 NOTE — Telephone Encounter (Signed)
Scheduled virtual follow-up for 01/24/2019 with Dr. Earlene Plater.

## 2019-01-22 NOTE — Telephone Encounter (Signed)
Please see if she would like follow up virtual visit.

## 2019-01-24 ENCOUNTER — Encounter: Payer: Self-pay | Admitting: Family Medicine

## 2019-01-24 ENCOUNTER — Ambulatory Visit (INDEPENDENT_AMBULATORY_CARE_PROVIDER_SITE_OTHER): Payer: No Typology Code available for payment source | Admitting: Family Medicine

## 2019-01-24 ENCOUNTER — Other Ambulatory Visit: Payer: Self-pay

## 2019-01-24 DIAGNOSIS — Z20828 Contact with and (suspected) exposure to other viral communicable diseases: Secondary | ICD-10-CM

## 2019-01-24 DIAGNOSIS — J342 Deviated nasal septum: Secondary | ICD-10-CM

## 2019-01-24 DIAGNOSIS — Z7189 Other specified counseling: Secondary | ICD-10-CM

## 2019-01-24 DIAGNOSIS — Z20822 Contact with and (suspected) exposure to covid-19: Secondary | ICD-10-CM

## 2019-01-24 DIAGNOSIS — Z9189 Other specified personal risk factors, not elsewhere classified: Secondary | ICD-10-CM | POA: Diagnosis not present

## 2019-01-24 DIAGNOSIS — R6889 Other general symptoms and signs: Secondary | ICD-10-CM

## 2019-01-24 DIAGNOSIS — R05 Cough: Secondary | ICD-10-CM | POA: Diagnosis not present

## 2019-01-24 DIAGNOSIS — H9202 Otalgia, left ear: Secondary | ICD-10-CM

## 2019-01-24 DIAGNOSIS — R059 Cough, unspecified: Secondary | ICD-10-CM

## 2019-01-24 MED ORDER — HYDROCODONE-HOMATROPINE 5-1.5 MG/5ML PO SYRP: 5.0000 mL | ORAL_SOLUTION | Freq: Every evening | ORAL | 0 refills | Status: DC | PRN

## 2019-01-24 MED ORDER — AZITHROMYCIN 250 MG PO TABS: ORAL_TABLET | ORAL | 0 refills | Status: DC

## 2019-01-24 MED ORDER — ALBUTEROL SULFATE HFA 108 (90 BASE) MCG/ACT IN AERS: 2.0000 | INHALATION_SPRAY | Freq: Four times a day (QID) | RESPIRATORY_TRACT | 0 refills | Status: DC | PRN

## 2019-01-24 MED FILL — ALBUTEROL SULFATE HFA 108 (: 108 (90 BAS | 25 days supply | Qty: 9 | Fill #0

## 2019-01-24 MED FILL — AZITHROMYCIN 250 MG TABLET: 250 | 5 days supply | Qty: 6 | Fill #0

## 2019-01-24 MED FILL — HYDROCODONE-HOMATROPINE SYR: 5-1.5 | 24 days supply | Qty: 120 | Fill #0

## 2019-01-24 NOTE — Progress Notes (Addendum)
Virtual Visit via Video   I connected with Jennifer Solis by a video enabled telemedicine application and verified that I am speaking with the correct person using two identifiers. Location patient: Home Location provider: [facility], Office Persons participating in the virtual visit: Jennifer Solis, Jennifer Rima, DO Jennifer Solis, CMA acting as scribe for Dr. Helane Solis.   I discussed the limitations of evaluation and management by telemedicine and the availability of in person appointments. The patient expressed understanding and agreed to proceed.  Subjective:   HPI: Patient was seen in ED on 4/13, records reviewed. She did have COVID  Testing, with negative result, but sample collection concern due to deviated septum.  Fever that started on Monday. The highest was 103.7. Last night she was at 102.4. She has been taking some of the hydrocodone cough medications and feels like that it helps her get some sleep. She is having some pain in left ear as well.     Reviewed all precautions and expectations with prevention of Covid-19. We have reviewed all options for what she needs to look out for. And directions for treatment. She is aware of what she needs to be on the look out to make sure that we need to get further treatment.   ROS: See pertinent positives and negatives per HPI.  Patient Active Problem List   Diagnosis Date Noted  . Attention or concentration deficit 10/11/2018  . Fever and chills 09/28/2018  . Deviated septum 08/22/2018  . Mixed obsessional thoughts and acts 08/22/2018  . GAD (generalized anxiety disorder) 08/22/2018  . OCD (obsessive compulsive disorder) 08/20/2018  . ADHD 08/20/2018  . IBS (irritable bowel syndrome) - D 05/14/2018  . Allergic rhinitis 12/27/2015  . Anxiety 12/27/2015  . Depression 12/27/2015  . GERD (gastroesophageal reflux disease) 12/27/2015  . Hyperlipidemia 12/27/2015  . Migraine 12/27/2015  . PCOS (polycystic ovarian syndrome)  12/27/2015    Social History   Tobacco Use  . Smoking status: Never Smoker  . Smokeless tobacco: Never Used  Substance Use Topics  . Alcohol use: No   Current Outpatient Medications:  .  albuterol (VENTOLIN HFA) 108 (90 Base) MCG/ACT inhaler, Inhale 2 puffs into the lungs every 6 (six) hours as needed for wheezing or shortness of breath., Disp: 1 Inhaler, Rfl: 0 .  cyclobenzaprine (FLEXERIL) 10 MG tablet, Take 1 tablet (10 mg total) by mouth 2 (two) times daily as needed for muscle spasms., Disp: 20 tablet, Rfl: 0 .  levocetirizine (XYZAL) 5 MG tablet, Take 5 mg by mouth every evening., Disp: , Rfl:  .  Multiple Vitamins-Minerals (MULTIVITAMIN ADULT PO), Take 1 tablet by mouth daily., Disp: , Rfl:  .  ondansetron (ZOFRAN ODT) 4 MG disintegrating tablet, Take 1 tablet (4 mg total) by mouth every 8 (eight) hours as needed for nausea or vomiting., Disp: 20 tablet, Rfl: 0 .  sertraline (ZOLOFT) 100 MG tablet, Take 100 mg by mouth at bedtime. , Disp: , Rfl:   Allergies  Allergen Reactions  . Acetaminophen Other (See Comments)    paralysis  . Augmentin [Amoxicillin-Pot Clavulanate] Nausea And Vomiting   Objective:   VITALS: Per patient if applicable, see vitals. GENERAL: Alert, appears ill, coughing. HEENT: Atraumatic, conjunctiva clear, no obvious abnormalities on inspection of external nose and ears. NECK: Normal movements of the head and neck. CARDIOPULMONARY: No increased WOB. Speaking in clear sentences. MS: Moves all visible extremities without noticeable abnormality. PSYCH: Pleasant and cooperative. Speech normal rate and rhythm. Affect is appropriate.  Insight and judgement are appropriate. Attention is focused, linear, and appropriate.  NEURO: CN grossly intact. Oriented as arrived to appointment on time with no prompting. Moves both UE equally.  SKIN: No obvious lesions, wounds, erythema, or cyanosis noted on face or hands.  Assessment and Plan:   Jennifer Solis was seen today for  follow-up.  Diagnoses and all orders for this visit:  Cough -     azithromycin (ZITHROMAX) 250 MG tablet; 2 tabs on day one and one a day after. -     albuterol (VENTOLIN HFA) 108 (90 Base) MCG/ACT inhaler; Inhale 2 puffs into the lungs every 6 (six) hours as needed for wheezing or shortness of breath. -     HYDROcodone-homatropine (HYCODAN) 5-1.5 MG/5ML syrup; Take 5 mLs by mouth at bedtime as needed for cough.  Suspected Covid-19 Virus Infection  At risk for spreading communicable disease  Educated About Covid-19 Virus Infection  Acute otalgia, left  Deviated septum    . Reviewed expectations re: course of current medical issues. . Discussed self-management of symptoms. . Outlined signs and symptoms indicating need for more acute intervention. . Patient verbalized understanding and all questions were answered. Marland Kitchen. Health Maintenance issues including appropriate healthy diet, exercise, and smoking avoidance were discussed with patient. . See orders for this visit as documented in the electronic medical record.  Jennifer RimaErica Tokiko Diefenderfer, DO

## 2019-01-25 NOTE — Patient Instructions (Signed)

## 2019-01-27 ENCOUNTER — Encounter: Payer: Self-pay | Admitting: Family Medicine

## 2019-01-28 DIAGNOSIS — Z0279 Encounter for issue of other medical certificate: Secondary | ICD-10-CM

## 2019-01-28 NOTE — Telephone Encounter (Signed)
Form filled out sent to reception to be charged and faxed. Patient informed will be completed and sent off today.

## 2019-02-03 ENCOUNTER — Other Ambulatory Visit: Payer: Self-pay

## 2019-02-04 ENCOUNTER — Other Ambulatory Visit: Payer: Self-pay

## 2019-02-04 ENCOUNTER — Ambulatory Visit (INDEPENDENT_AMBULATORY_CARE_PROVIDER_SITE_OTHER): Payer: No Typology Code available for payment source | Admitting: Family Medicine

## 2019-02-04 ENCOUNTER — Telehealth: Payer: No Typology Code available for payment source | Admitting: Physician Assistant

## 2019-02-04 DIAGNOSIS — Z9189 Other specified personal risk factors, not elsewhere classified: Secondary | ICD-10-CM | POA: Diagnosis not present

## 2019-02-04 DIAGNOSIS — Z7189 Other specified counseling: Secondary | ICD-10-CM

## 2019-02-04 DIAGNOSIS — J069 Acute upper respiratory infection, unspecified: Secondary | ICD-10-CM

## 2019-02-04 DIAGNOSIS — R6889 Other general symptoms and signs: Secondary | ICD-10-CM

## 2019-02-04 DIAGNOSIS — R05 Cough: Secondary | ICD-10-CM | POA: Diagnosis not present

## 2019-02-04 DIAGNOSIS — Z20822 Contact with and (suspected) exposure to covid-19: Secondary | ICD-10-CM

## 2019-02-04 DIAGNOSIS — R059 Cough, unspecified: Secondary | ICD-10-CM

## 2019-02-04 MED ORDER — BENZONATATE 100 MG PO CAPS: 100.0000 mg | ORAL_CAPSULE | Freq: Three times a day (TID) | ORAL | 1 refills | Status: DC | PRN

## 2019-02-04 MED ORDER — HYDROCODONE-HOMATROPINE 5-1.5 MG/5ML PO SYRP: 5.0000 mL | ORAL_SOLUTION | Freq: Every evening | ORAL | 0 refills | Status: DC | PRN

## 2019-02-04 MED ORDER — PREDNISONE 5 MG PO TABS: 5.0000 mg | ORAL_TABLET | Freq: Every day | ORAL | 0 refills | Status: DC

## 2019-02-04 MED FILL — BENZONATATE 100 MG CAPS: 100 | 10 days supply | Qty: 30 | Fill #0

## 2019-02-04 MED FILL — predniSONE 5 MG (21) TBPK: 5 | 6 days supply | Qty: 21 | Fill #0

## 2019-02-04 NOTE — Progress Notes (Signed)
Virtual Visit via Video   Due to the COVID-19 pandemic, this visit was completed with telemedicine (audio/video) technology to reduce patient and provider exposure as well as to preserve personal protective equipment.   I connected with Jennifer Solis by a video enabled telemedicine application and verified that I am speaking with the correct person using two identifiers. Location patient: Home Location provider:  [facility], [facility] Persons participating in the virtual visit: Jennifer Solis, Jennifer Rimarica Tiari Andringa, DO Barnie MortJoEllen Thompson, CMA acting as scribe for Dr. Helane RimaErica Hara Milholland.   I discussed the limitations of evaluation and management by telemedicine and the availability of in person appointments. The patient expressed understanding and agreed to proceed.  Care Team   Patient Care Team: Jennifer Solis, Jennifer Beauchaine, DO as PCP - General (Family Medicine) Specialist, [place] Attention (Psychology)  Subjective:   HPI: Patient was seen by Employee health today and informed that she needed further treatment from her PCP. She I still having ongoing cough. She is not feeling well at all. She has not had any fever. She does have headache and body aches   Per e visit questions: Please describe what kind of difficulty you are having breathing. Answer:   [facility] per your triage nurse's recommendation for rule out Covid-19. I was told I had a false-negative due to improper swabbing and to stay quarantined for 14 days. Today I had to go to [facility] as a follow up for Health at Work. They said they could not clear me to return to work due to my persistent, almost constant cough, and continued fatigue, headache, body aches, and loss of appetite. My O2 was at 93%, unless I was talking, in which case it went up to 95%. That is very abnormal for me. My heartrate was also 115, which is very high compared to my usual 67-72. They told me to have an E-Visit with my PCP Dr. Earlene PlaterWallace to see about  getting prednisone, more cough syrup, Tessalon perles, and whether a breathing treatment was needed or not. They also said to see about a timeframe for me to give my supervisor on when I will return to work.  Patient Active Problem List   Diagnosis Date Noted  . Attention or concentration deficit 10/11/2018  . Fever and chills 09/28/2018  . Deviated septum 08/22/2018  . Mixed obsessional thoughts and acts 08/22/2018  . GAD (generalized anxiety disorder) 08/22/2018  . OCD (obsessive compulsive disorder) 08/20/2018  . ADHD 08/20/2018  . IBS (irritable bowel syndrome) - D 05/14/2018  . Allergic rhinitis 12/27/2015  . Anxiety 12/27/2015  . Depression 12/27/2015  . GERD (gastroesophageal reflux disease) 12/27/2015  . Hyperlipidemia 12/27/2015  . Migraine 12/27/2015  . PCOS (polycystic ovarian syndrome) 12/27/2015    Social History   Tobacco Use  . Smoking status: Never Smoker  . Smokeless tobacco: Never Used  Substance Use Topics  . Alcohol use: No    Current Outpatient Medications:  .  albuterol (VENTOLIN HFA) 108 (90 Base) MCG/ACT inhaler, Inhale 2 puffs into the lungs every 6 (six) hours as needed for wheezing or shortness of breath., Disp: 1 Inhaler, Rfl: 0 .  cyclobenzaprine (FLEXERIL) 10 MG tablet, Take 1 tablet (10 mg total) by mouth 2 (two) times daily as needed for muscle spasms., Disp: 20 tablet, Rfl: 0 .  HYDROcodone-homatropine (HYCODAN) 5-1.5 MG/5ML syrup, Take 5 mLs by mouth at bedtime as needed for cough., Disp: 120 mL, Rfl: 0 .  levocetirizine (XYZAL) 5 MG  tablet, Take 5 mg by mouth every evening., Disp: , Rfl:  .  Multiple Vitamins-Minerals (MULTIVITAMIN ADULT PO), Take 1 tablet by mouth daily., Disp: , Rfl:  .  ondansetron (ZOFRAN ODT) 4 MG disintegrating tablet, Take 1 tablet (4 mg total) by mouth every 8 (eight) hours as needed for nausea or vomiting., Disp: 20 tablet, Rfl: 0 .  sertraline (ZOLOFT) 100 MG tablet, Take 100 mg by mouth at bedtime. , Disp: , Rfl:  .   benzonatate (TESSALON PERLES) 100 MG capsule, Take 1 capsule (100 mg total) by mouth 3 (three) times daily as needed for cough., Disp: 30 capsule, Rfl: 1 .  predniSONE (DELTASONE) 5 MG tablet, Take 1 tablet (5 mg total) by mouth daily with breakfast. 6-5-4-3-2-1, Disp: 21 tablet, Rfl: 0  Allergies  Allergen Reactions  . Acetaminophen Other (See Comments)    paralysis  . Augmentin [Amoxicillin-Pot Clavulanate] Nausea And Vomiting    Objective:   VITALS: Per patient if applicable, see vitals. GENERAL: Alert, appears well and in no acute distress. HEENT: Atraumatic, conjunctiva clear, no obvious abnormalities on inspection of external nose and ears. NECK: Normal movements of the head and neck. CARDIOPULMONARY: No increased WOB. Speaking in clear sentences. Cough. MS: Moves all visible extremities without noticeable abnormality. PSYCH: Pleasant and cooperative, well-groomed. Speech normal rate and rhythm. Affect is appropriate. Insight and judgement are appropriate. Attention is focused, linear, and appropriate.  NEURO: CN grossly intact. Oriented as arrived to appointment on time with no prompting. Moves both UE equally.  SKIN: No obvious lesions, wounds, erythema, or cyanosis noted on face or hands.  Depression screen PHQ 2/9 08/20/2018  Decreased Interest 1  Down, Depressed, Hopeless 0  PHQ - 2 Score 1  Altered sleeping 2  Tired, decreased energy 2  Change in appetite 1  Feeling bad or failure about yourself  0  Trouble concentrating 2  Moving slowly or fidgety/restless 1  Suicidal thoughts 0  PHQ-9 Score 9  Difficult doing work/chores Somewhat difficult    Assessment and Plan:   Diagnoses and all orders for this visit:  Cough -     predniSONE (DELTASONE) 5 MG tablet; Take 1 tablet (5 mg total) by mouth daily with breakfast. 6-5-4-3-2-1 -     HYDROcodone-homatropine (HYCODAN) 5-1.5 MG/5ML syrup; Take 5 mLs by mouth at bedtime as needed for cough. -     benzonatate (TESSALON  PERLES) 100 MG capsule; Take 1 capsule (100 mg total) by mouth 3 (three) times daily as needed for cough.  Suspected Covid-19 Virus Infection  Educated About Covid-19 Virus Infection  At risk for spreading communicable disease    . COVID-19 Education:The signs and symptoms of COVID-19 were discussed with the patient and how to seek care for testing if needed. The importance of social distancing was discussed today. . Reviewed expectations re: course of current medical issues. . Discussed self-management of symptoms. . Outlined signs and symptoms indicating need for more acute intervention. . Patient verbalized understanding and all questions were answered. Marland Kitchen Health Maintenance issues including appropriate healthy diet, exercise, and smoking avoidance were discussed with patient. . See orders for this visit as documented in the electronic medical record.  Jennifer Rima, DO 02/08/2019  Records requested if needed. Time spent: 15 minutes, of which >50% was spent in obtaining information about her symptoms, reviewing her previous labs, evaluations, and treatments, counseling her about her condition (please see the discussed topics above), and developing a plan to further investigate it; she had a number  of questions which I addressed.

## 2019-02-04 NOTE — Progress Notes (Signed)
Hi Jennifer Solis,  You have some concerning complaints. I have no way to evaluate you physically and I think the scope of you complaints requires a physical exam.  I would recommend you contact [facility] where you were seen two days ago for further guidance on this illness.  If this is out of the realm of possibilities and you feel your symptoms warrant urgent evaluation, I would recommend going to the [facility] at the [facility] where they can assess your complaints. I am sorry for this inconvenience but it is not safe to advise you otherwise in the online format. Of course you will not be charged for this visit.   Deliah Boston PA-C, 11:27 AM    ===View-only below this line===   ----- Message -----    From: Jennifer Solis    Sent: 02/04/2019 11:19 AM EDT      To: E-Visit Mailing List Subject: E-Visit Submission: Cough  E-Visit Submission: Cough --------------------------------  Question: How long have you been coughing? Answer:   7 or more days  Question: How would you describe the cough? Answer:   None of the Above  Question: How often are you coughing? Answer:   Constantly  Question: Does the cough prevent you from sleeping at night? Answer:   Yes  Question: What other symptoms have you experienced with the cough? Answer:   Headache            Wheezing  Question: Do you have a fever? Answer:   No, I do not have a fever  Question: Are you coughing up any mucus? Answer:   No, it's a dry cough  Question: Do you use a maintenance inhaler? Answer:   No  Question: Do you use a rescue inhaler (such as Ventolin?) Answer:   No  Question: Have you previously required a prescription for prednisone for cough? Answer:   Yes  Question: Are you diabetic? Answer:   No  Question: Are you pregnant? Answer:   I am confident that I am not pregnant  Question: Are you breastfeeding? Answer:   No  Question: Do you have any of the following? Answer:   Loss of  appetite            Fatigue  Question: Do you smoke? Answer:   No  Question: Have you ever smoked? Answer:   I have never smoked  Question: Are there people you know with similar symptoms? Answer:   No  Question: Are you experiencing any of the following? Answer:   Coughing more when lying  Question: Are you having difficulty breathing? Answer:   Yes  Question: Please describe what kind of difficulty you are having breathing. Answer:   I was seen at [facility] on 4/13 per your triage nurse's recommendation for rule out Covid-19. I was told I had a false-negative due to improper swabbing and to stay quarantined for 14 days. Today I had to go to [employer] as a follow up for Health at Work. They said they could not clear me to return to work due to my persistent, almost constant cough, and continued fatigue, headache, body aches, and loss of appetite. My O2 was at 93%, unless I was talking, in which case it went up to 95%. That is very abnormal for me. My heartrate was also 115, which is very high compared to my usual 67-72. They told me to have an E-Visit with my PCP Dr. Earlene Plater to see about getting  prednisone, more cough syrup, Tessalon perles, and whether a breathing treatment was needed or not. They also said to see about a timeframe for me to give my supervisor on when I will return to work.  Question: Is your coughing worse when you are exposed to pollen, dust, or other things in the environment? Answer:   No  Question: Have you been treated for a similar cough in the past? Answer:   No  Question: Have you ever been diagnosed with asthma, bronchitis, or lung disease? Answer:   Yes  Question: Please enter a few details about your earlier diagnosis and treatment Answer:   I have been diagnosed with bronchitis a few times. The most recent was in 2018.  Question: Have you recently started on any medications for your heart or for blood pressure? Answer:    No  Question: Have you recently been hospitalized? Answer:   No  Question: Please list your medication allergies that you may have ? (If 'none' , please list as 'none') Answer:   Augmentin             Extra Strength Tylenol  Question: Please list any additional comments  Answer:     A total of 5-10 minutes was spent evaluating this patients questionnaire and formulating a plan of care.

## 2019-02-05 ENCOUNTER — Encounter: Payer: Self-pay | Admitting: Family Medicine

## 2019-02-06 NOTE — Telephone Encounter (Signed)
Do you have a date she can to back to work or do you need me to give it until her follow up?

## 2019-02-08 ENCOUNTER — Encounter: Payer: Self-pay | Admitting: Family Medicine

## 2019-02-08 NOTE — Patient Instructions (Signed)

## 2019-02-10 ENCOUNTER — Encounter: Payer: Self-pay | Admitting: Family Medicine

## 2019-02-18 ENCOUNTER — Ambulatory Visit: Payer: No Typology Code available for payment source | Admitting: Family Medicine

## 2019-05-04 NOTE — Progress Notes (Signed)
Jennifer Solis is a 28 y.o. female is here for follow up.  History of Present Illness:   Jennifer Solis, CMA acting as scribe for Dr. Briscoe Deutscher.   HPI: Patient would like to have CPE done so that forms for school can be completed.Patient due for HPV and will get today in addition to any other immunizations needed.  She is having some issues with congestion and sinus pressure and pain. Suspected COVID a few months ago. Weight gain - 30 pounds. More sedentary and eating poorly. BP up in office - states due to anxiety, white coat HTN. Checks at work - 120s/80s.   There are no preventive care reminders to display for this patient. Depression screen [facility] 2/9 05/05/2019 08/20/2018  Decreased Interest 0 1  Down, Depressed, Hopeless 0 0  PHQ - 2 Score 0 1  Altered sleeping 2 2  Tired, decreased energy 2 2  Change in appetite 2 1  Feeling bad or failure about yourself  0 0  Trouble concentrating 1 2  Moving slowly or fidgety/restless 0 1  Suicidal thoughts 0 0  PHQ-9 Score 7 9  Difficult doing work/chores - Somewhat difficult   PMHx, SurgHx, SocialHx, FamHx, Medications, and Allergies were reviewed in the Visit Navigator and updated as appropriate.   Patient Active Problem List   Diagnosis Date Noted  . Attention or concentration deficit 10/11/2018  . Fever and chills 09/28/2018  . Deviated septum 08/22/2018  . Mixed obsessional thoughts and acts 08/22/2018  . GAD (generalized anxiety disorder) 08/22/2018  . OCD (obsessive compulsive disorder) 08/20/2018  . ADHD 08/20/2018  . IBS (irritable bowel syndrome) - D 05/14/2018  . Allergic rhinitis 12/27/2015  . Anxiety 12/27/2015  . Depression 12/27/2015  . GERD (gastroesophageal reflux disease) 12/27/2015  . Hyperlipidemia 12/27/2015  . Migraine 12/27/2015  . PCOS (polycystic ovarian syndrome) 12/27/2015   Social History   Tobacco Use  . Smoking status: Never Smoker  . Smokeless tobacco: Never Used  Substance Use Topics  .  Alcohol use: No  . Drug use: Never   Current Medications and Allergies   Current Outpatient Medications:  .  albuterol (VENTOLIN HFA) 108 (90 Base) MCG/ACT inhaler, Inhale 2 puffs into the lungs every 6 (six) hours as needed for wheezing or shortness of breath., Disp: 1 Inhaler, Rfl: 0 .  Multiple Vitamins-Minerals (MULTIVITAMIN ADULT PO), Take 1 tablet by mouth daily., Disp: , Rfl:  .  sertraline (ZOLOFT) 100 MG tablet, Take 100 mg by mouth at bedtime. , Disp: , Rfl:  .  azithromycin (ZITHROMAX) 250 MG tablet, Two po on day one, then one po each day after until gone., Disp: 6 tablet, Rfl: 0   Allergies  Allergen Reactions  . Acetaminophen Other (See Comments)    paralysis  . Augmentin [Amoxicillin-Pot Clavulanate] Nausea And Vomiting   Review of Systems   Pertinent items are noted in the HPI. Otherwise, a complete ROS is negative.  Vitals   Vitals:   05/05/19 1032 05/05/19 1133  BP: (!) 160/98 (!) 158/118  Pulse: 85   Temp: 98.3 F (36.8 C)   TempSrc: Oral   SpO2: 97%   Weight: 231 lb 9.6 oz (105.1 kg)   Height: 5\' 4"  (1.626 m)      Body mass index is 39.75 kg/m.  Physical Exam   Physical Exam Vitals signs and nursing note reviewed.  HENT:     Head: Normocephalic and atraumatic.  Eyes:     Pupils: Pupils are equal, round,  and reactive to light.  Neck:     Musculoskeletal: Normal range of motion and neck supple.  Cardiovascular:     Rate and Rhythm: Normal rate and regular rhythm.     Heart sounds: Normal heart sounds.  Pulmonary:     Effort: Pulmonary effort is normal.     Breath sounds: Wheezing present.  Abdominal:     Palpations: Abdomen is soft.  Skin:    General: Skin is warm.  Psychiatric:        Behavior: Behavior normal.    Assessment and Plan   Jennifer Solis was seen today for follow-up.  Diagnoses and all orders for this visit:  Cough Comments: Symbicort also provided as sample. Continued cough DDx: postviral, RAD, allergy, reflux. Discussed  all. Orders: -     CBC with Differential/Platelet -     Comprehensive metabolic panel -     DG Chest 2 View; Future -     azithromycin (ZITHROMAX) 250 MG tablet; Two po on day one, then one po each day after until gone.  Need for HPV vaccination -     HPV 9-valent vaccine,Recombinat  Screening for tuberculosis -     QuantiFERON-TB Gold Plus  Other fatigue -     TSH -     Cancel: POCT urine pregnancy  Bacterial sinusitis -     azithromycin (ZITHROMAX) 250 MG tablet; Two po on day one, then one po each day after until gone.  Screening for lipid disorders  Pure hypercholesterolemia -     Lipid panel  PCOS (polycystic ovarian syndrome) -     Hemoglobin A1c   . Orders and follow up as documented in EpicCare, reviewed diet, exercise and weight control, cardiovascular risk and specific lipid/LDL goals reviewed, reviewed medications and side effects in detail.  . Reviewed expectations re: course of current medical issues. . Outlined signs and symptoms indicating need for more acute intervention. . Patient verbalized understanding and all questions were answered. . Patient received an After Visit Summary.  CMA served as Neurosurgeonscribe during this visit. History, Physical, and Plan performed by medical provider. The above documentation has been reviewed and is accurate and complete. Helane RimaErica Ecko Beasley, D.O.  Helane RimaErica Kelvyn Schunk, DO [place] 05/05/2019

## 2019-05-05 ENCOUNTER — Ambulatory Visit (INDEPENDENT_AMBULATORY_CARE_PROVIDER_SITE_OTHER): Payer: No Typology Code available for payment source

## 2019-05-05 ENCOUNTER — Ambulatory Visit (INDEPENDENT_AMBULATORY_CARE_PROVIDER_SITE_OTHER): Payer: No Typology Code available for payment source | Admitting: Family Medicine

## 2019-05-05 ENCOUNTER — Other Ambulatory Visit: Payer: Self-pay

## 2019-05-05 ENCOUNTER — Encounter: Payer: Self-pay | Admitting: Family Medicine

## 2019-05-05 VITALS — BP 158/118 | HR 85 | Temp 98.3°F | Ht 64.0 in | Wt 231.6 lb

## 2019-05-05 DIAGNOSIS — R5383 Other fatigue: Secondary | ICD-10-CM

## 2019-05-05 DIAGNOSIS — Z111 Encounter for screening for respiratory tuberculosis: Secondary | ICD-10-CM | POA: Diagnosis not present

## 2019-05-05 DIAGNOSIS — E7800 Pure hypercholesterolemia, unspecified: Secondary | ICD-10-CM

## 2019-05-05 DIAGNOSIS — R059 Cough, unspecified: Secondary | ICD-10-CM

## 2019-05-05 DIAGNOSIS — R05 Cough: Secondary | ICD-10-CM

## 2019-05-05 DIAGNOSIS — Z0001 Encounter for general adult medical examination with abnormal findings: Secondary | ICD-10-CM | POA: Diagnosis not present

## 2019-05-05 DIAGNOSIS — E282 Polycystic ovarian syndrome: Secondary | ICD-10-CM | POA: Diagnosis not present

## 2019-05-05 DIAGNOSIS — Z13220 Encounter for screening for lipoid disorders: Secondary | ICD-10-CM

## 2019-05-05 DIAGNOSIS — B9689 Other specified bacterial agents as the cause of diseases classified elsewhere: Secondary | ICD-10-CM

## 2019-05-05 DIAGNOSIS — J329 Chronic sinusitis, unspecified: Secondary | ICD-10-CM

## 2019-05-05 DIAGNOSIS — Z23 Encounter for immunization: Secondary | ICD-10-CM

## 2019-05-05 LAB — HEMOGLOBIN A1C: Hgb A1c MFr Bld: 6.2 % (ref 4.6–6.5)

## 2019-05-05 LAB — CBC WITH DIFFERENTIAL/PLATELET
Basophils Absolute: 0 10*3/uL (ref 0.0–0.1)
Basophils Relative: 0.5 % (ref 0.0–3.0)
Eosinophils Absolute: 0.1 10*3/uL (ref 0.0–0.7)
Eosinophils Relative: 1.2 % (ref 0.0–5.0)
HCT: 47.3 % — ABNORMAL HIGH (ref 36.0–46.0)
Hemoglobin: 15.6 g/dL — ABNORMAL HIGH (ref 12.0–15.0)
Lymphocytes Relative: 29 % (ref 12.0–46.0)
Lymphs Abs: 2.4 10*3/uL (ref 0.7–4.0)
MCHC: 32.9 g/dL (ref 30.0–36.0)
MCV: 93.4 fl (ref 78.0–100.0)
Monocytes Absolute: 0.5 10*3/uL (ref 0.1–1.0)
Monocytes Relative: 6.4 % (ref 3.0–12.0)
Neutro Abs: 5.3 10*3/uL (ref 1.4–7.7)
Neutrophils Relative %: 62.9 % (ref 43.0–77.0)
Platelets: 343 10*3/uL (ref 150.0–400.0)
RBC: 5.06 Mil/uL (ref 3.87–5.11)
RDW: 13.8 % (ref 11.5–15.5)
WBC: 8.4 10*3/uL (ref 4.0–10.5)

## 2019-05-05 LAB — COMPREHENSIVE METABOLIC PANEL
ALT: 97 U/L — ABNORMAL HIGH (ref 0–35)
AST: 58 U/L — ABNORMAL HIGH (ref 0–37)
Albumin: 4.7 g/dL (ref 3.5–5.2)
Alkaline Phosphatase: 69 U/L (ref 39–117)
BUN: 11 mg/dL (ref 6–23)
CO2: 30 mEq/L (ref 19–32)
Calcium: 10.3 mg/dL (ref 8.4–10.5)
Chloride: 99 mEq/L (ref 96–112)
Creatinine, Ser: 0.9 mg/dL (ref 0.40–1.20)
GFR: 73.36 mL/min (ref >60.00)
Glucose, Bld: 163 mg/dL — ABNORMAL HIGH (ref 70–99)
Potassium: 3.9 mEq/L (ref 3.5–5.1)
Sodium: 138 mEq/L (ref 135–145)
Total Bilirubin: 0.7 mg/dL (ref 0.2–1.2)
Total Protein: 7.9 g/dL (ref 6.0–8.3)

## 2019-05-05 LAB — LIPID PANEL
Cholesterol: 252 mg/dL — ABNORMAL HIGH (ref 0–200)
HDL: 50.1 mg/dL (ref >39.00)
LDL Cholesterol: 169 mg/dL — ABNORMAL HIGH (ref 0–99)
NonHDL: 202.36
Total CHOL/HDL Ratio: 5
Triglycerides: 167 mg/dL — ABNORMAL HIGH (ref 0.0–149.0)
VLDL: 33.4 mg/dL (ref 0.0–40.0)

## 2019-05-05 LAB — TSH: TSH: 1.22 u[IU]/mL (ref 0.35–4.50)

## 2019-05-05 MED ORDER — AZITHROMYCIN 250 MG PO TABS: ORAL_TABLET | ORAL | 0 refills | Status: DC

## 2019-05-05 MED FILL — AZITHROMYCIN 250 MG TABLET: 250 | 5 days supply | Qty: 6 | Fill #0

## 2019-05-06 ENCOUNTER — Encounter: Payer: Self-pay | Admitting: Family Medicine

## 2019-05-07 ENCOUNTER — Other Ambulatory Visit: Payer: Self-pay

## 2019-05-07 DIAGNOSIS — R05 Cough: Secondary | ICD-10-CM

## 2019-05-07 DIAGNOSIS — R059 Cough, unspecified: Secondary | ICD-10-CM

## 2019-05-07 LAB — QUANTIFERON-TB GOLD PLUS
Mitogen-NIL: >10 IU/mL
NIL: 0.02 IU/mL
QuantiFERON-TB Gold Plus: NEGATIVE
TB1-NIL: 0 IU/mL
TB2-NIL: 0 IU/mL

## 2019-05-07 MED ORDER — SERTRALINE HCL 100 MG PO TABS: 100.0000 mg | ORAL_TABLET | Freq: Every day | ORAL | 1 refills | Status: DC

## 2019-05-07 MED ORDER — ALBUTEROL SULFATE HFA 108 (90 BASE) MCG/ACT IN AERS: 2.0000 | INHALATION_SPRAY | Freq: Four times a day (QID) | RESPIRATORY_TRACT | 1 refills | Status: DC | PRN

## 2019-05-07 MED FILL — SERTRALINE HCL 100 MG TAB: 100 | 90 days supply | Qty: 90 | Fill #0

## 2019-05-07 MED FILL — ALBUTEROL SULFATE HFA 108 (: 108 (90 BAS | 25 days supply | Qty: 9 | Fill #0

## 2019-05-08 MED ORDER — ONDANSETRON 4 MG PO TBDP: 4.0000 mg | ORAL_TABLET | Freq: Three times a day (TID) | ORAL | 0 refills | Status: DC | PRN

## 2019-05-08 MED ORDER — HYDROCODONE-HOMATROPINE 5-1.5 MG/5ML PO SYRP: 5.0000 mL | ORAL_SOLUTION | Freq: Every evening | ORAL | 0 refills | Status: DC | PRN

## 2019-05-08 MED FILL — ONDANSETRON ODT 4 MG TABLET: 4 | 13 days supply | Qty: 20 | Fill #0

## 2019-05-08 NOTE — Telephone Encounter (Signed)
Have pulled up med if ok to send.

## 2019-05-20 ENCOUNTER — Encounter: Payer: Self-pay | Admitting: Family Medicine

## 2019-05-23 ENCOUNTER — Other Ambulatory Visit: Payer: Self-pay

## 2019-05-23 DIAGNOSIS — Z789 Other specified health status: Secondary | ICD-10-CM

## 2019-06-11 ENCOUNTER — Other Ambulatory Visit: Payer: Self-pay

## 2019-06-11 ENCOUNTER — Encounter: Payer: Self-pay | Admitting: Family Medicine

## 2019-06-11 ENCOUNTER — Ambulatory Visit (INDEPENDENT_AMBULATORY_CARE_PROVIDER_SITE_OTHER): Payer: No Typology Code available for payment source

## 2019-06-11 DIAGNOSIS — Z23 Encounter for immunization: Secondary | ICD-10-CM

## 2019-08-12 ENCOUNTER — Ambulatory Visit: Payer: No Typology Code available for payment source | Admitting: Family Medicine

## 2020-03-02 MED FILL — VYVANSE 20 MG CAPSULE: 20 | 30 days supply | Qty: 30 | Fill #0

## 2021-04-05 ENCOUNTER — Ambulatory Visit (INDEPENDENT_AMBULATORY_CARE_PROVIDER_SITE_OTHER): Payer: No Typology Code available for payment source | Admitting: Nurse Practitioner

## 2021-04-05 ENCOUNTER — Encounter: Payer: Self-pay | Admitting: Nurse Practitioner

## 2021-04-05 ENCOUNTER — Other Ambulatory Visit: Payer: Self-pay

## 2021-04-05 ENCOUNTER — Other Ambulatory Visit (HOSPITAL_COMMUNITY): Payer: Self-pay

## 2021-04-05 VITALS — BP 188/138 | HR 100 | Temp 98.5°F | Ht 63.0 in | Wt 224.0 lb

## 2021-04-05 DIAGNOSIS — L819 Disorder of pigmentation, unspecified: Secondary | ICD-10-CM

## 2021-04-05 DIAGNOSIS — E282 Polycystic ovarian syndrome: Secondary | ICD-10-CM

## 2021-04-05 DIAGNOSIS — I10 Essential (primary) hypertension: Secondary | ICD-10-CM | POA: Insufficient documentation

## 2021-04-05 DIAGNOSIS — Z7689 Persons encountering health services in other specified circumstances: Secondary | ICD-10-CM | POA: Insufficient documentation

## 2021-04-05 DIAGNOSIS — Z0001 Encounter for general adult medical examination with abnormal findings: Secondary | ICD-10-CM | POA: Insufficient documentation

## 2021-04-05 DIAGNOSIS — Z139 Encounter for screening, unspecified: Secondary | ICD-10-CM | POA: Diagnosis not present

## 2021-04-05 DIAGNOSIS — E7800 Pure hypercholesterolemia, unspecified: Secondary | ICD-10-CM

## 2021-04-05 MED ORDER — AMLODIPINE BESYLATE 5 MG PO TABS
5.0000 mg | ORAL_TABLET | Freq: Every day | ORAL | 1 refills | Status: DC
  Filled 2021-04-05 (×2): qty 30, 30d supply, fill #0
  Filled 2021-05-11: qty 30, 30d supply, fill #1

## 2021-04-05 NOTE — Assessment & Plan Note (Signed)
-  she would like referral to GYN for PCOS and well woman exam with PAP

## 2021-04-05 NOTE — Patient Instructions (Signed)
Please have fasting labs drawn 2-3 days prior to your appointment so we can discuss the results during your office visit.  

## 2021-04-05 NOTE — Assessment & Plan Note (Signed)
-  to face -pt requesting derm referral; will honor that request

## 2021-04-05 NOTE — Assessment & Plan Note (Signed)
-  followed by [facility] for ADHD

## 2021-04-05 NOTE — Assessment & Plan Note (Signed)
-  will screen for HCV with the next set of labs

## 2021-04-05 NOTE — Progress Notes (Signed)
New Patient Office Visit  Subjective:  Patient ID: Jennifer Solis, female    DOB: October 17, 1990  Age: 30 y.o. MRN: [id number]  CC:  Chief Complaint  Patient presents with   New Patient (Initial Visit)    Here to establish care.   Annual Exam    CPE for school-need forms filled out.    HPI Jennifer Solis presents for new patient visit. Transferring care from Dr. Juleen China with [place] [facility] Last physical  was  several years ago. Last labs were drawn at that time.  She states her BP has been elevated recently.  She would like a dermatology referral because she has some facial pigmentation. She states the spots on her face are causing her distress.  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   Past Medical History:  Diagnosis Date   ADHD    Migraines    Polycystic ovarian syndrome    TBI (traumatic brain injury) ([place]) 2012   with skull fracture after fall     Past Surgical History:  Procedure Laterality Date   KNEE SURGERY Left    RHINOPLASTY  2011    History reviewed. No pertinent family history.  Social History   Socioeconomic History   Marital status: Single    Spouse name: Not on file   Number of children: 0   Years of education: Not on file   Highest education level: Not on file  Occupational History   Occupation: RN- [employer]  Tobacco Use   Smoking status: Never   Smokeless tobacco: Never  Vaping Use   Vaping Use: Never used  Substance and Sexual Activity   Alcohol use: Yes    Comment: maybe 1 drink per month   Drug use: Never   Sexual activity: Not Currently  Other Topics Concern   Not on file  Social History Narrative   Not on file   Social Determinants of Health   Financial Resource Strain: Not on file  Food Insecurity: Not on file  Transportation Needs: Not on file  Physical Activity: Not on file  Stress: Not on file  Social Connections: Not on file  Intimate Partner Violence: Not on file    ROS Review of  Systems  Constitutional: Negative.   HENT: Negative.    Eyes: Negative.   Respiratory: Negative.    Cardiovascular: Negative.   Gastrointestinal: Negative.   Endocrine: Negative.   Genitourinary: Negative.   Musculoskeletal: Negative.   Skin:  Positive for color change.       Hyperpigmenation to face; she would like derm referral  Allergic/Immunologic: Negative.   Neurological: Negative.   Hematological: Negative.   Psychiatric/Behavioral:  Negative for self-injury and suicidal ideas.        Hx of ADHD, followed by attention specialist    Objective:   Today's Vitals: BP (!) 188/138 (BP Location: Right Arm, Patient Position: Sitting, Cuff Size: Large)   Pulse 100   Temp 98.5 F (36.9 C) (Temporal)   Ht _0  (1.6 m)   Wt 224 lb (101.6 kg)   LMP 03/12/2021 (Exact Date)   SpO2 97%   BMI 39.68 kg/m   Physical Exam Constitutional:      Appearance: Normal appearance. She is obese.  HENT:     Head: Normocephalic and atraumatic.     Right Ear: Tympanic membrane, ear canal and external ear normal.     Left Ear: Tympanic membrane, ear canal and external ear normal.     Nose:  Nose normal.     Mouth/Throat:     Mouth: Mucous membranes are moist.     Pharynx: Oropharynx is clear.  Eyes:     Extraocular Movements: Extraocular movements intact.     Conjunctiva/sclera: Conjunctivae normal.     Pupils: Pupils are equal, round, and reactive to light.  Cardiovascular:     Rate and Rhythm: Normal rate and regular rhythm.     Pulses: Normal pulses.     Heart sounds: Normal heart sounds.  Pulmonary:     Effort: Pulmonary effort is normal.     Breath sounds: Normal breath sounds.  Abdominal:     General: Abdomen is flat. Bowel sounds are normal.     Palpations: Abdomen is soft.  Musculoskeletal:        General: Normal range of motion.     Cervical back: Normal range of motion and neck supple.  Skin:    Capillary Refill: Capillary refill takes less than 2 seconds.     Comments:  -hyperpigmentation to face difficult to visualized d/t makeup  Neurological:     General: No focal deficit present.     Mental Status: She is alert and oriented to person, place, and time.     Cranial Nerves: No cranial nerve deficit.     Sensory: No sensory deficit.     Motor: No weakness.     Coordination: Coordination normal.     Gait: Gait normal.  Psychiatric:        Mood and Affect: Mood normal.        Behavior: Behavior normal.        Thought Content: Thought content normal.        Judgment: Judgment normal.    Assessment & Plan:   Problem List Items Addressed This Visit       Cardiovascular and Mediastinum   HTN (hypertension)    BP Readings from Last 3 Encounters:  04/05/21 (!) 188/138  05/05/19 (!) 158/118  01/21/19 (!) 129/92  -Rx. amlodipine -recheck in 2 weeks       Relevant Medications   amLODipine (NORVASC) 5 MG tablet     Endocrine   PCOS (polycystic ovarian syndrome)    -she would like referral to GYN for PCOS and well woman exam with PAP       Relevant Orders   Ambulatory referral to Gynecology     Musculoskeletal and Integument   Hyperpigmentation    -to face -pt requesting derm referral; will honor that request       Relevant Orders   Ambulatory referral to Dermatology     Other   Hyperlipidemia    -will check labs       Relevant Medications   amLODipine (NORVASC) 5 MG tablet   Encounter to establish care - Primary    -obtain records       Screening due    -will screen for HCV with the next set of labs       Relevant Orders   Hepatitis C Antibody   Encounter for general adult medical examination with abnormal findings    -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  Multiple Vitamins-Minerals (MULTIVITAMIN ADULT PO) Take 1 tablet by mouth daily.   ondansetron (ZOFRAN ODT) 4 MG disintegrating tablet Take 1 tablet (4 mg total) by mouth every 8 (eight) hours as needed for nausea or vomiting.   [DISCONTINUED] albuterol (VENTOLIN HFA) 108 (90 Base) MCG/ACT inhaler Inhale 2 puffs into the lungs every 6 (six) hours as needed for wheezing or shortness of breath.   [DISCONTINUED] azithromycin (ZITHROMAX) 250 MG tablet Two po on day one, then one po each day after until gone.   [DISCONTINUED] HYDROcodone-homatropine (HYCODAN) 5-1.5 MG/5ML syrup Take 5 mLs by mouth at bedtime as needed for cough.   [DISCONTINUED] sertraline (ZOLOFT) 100 MG tablet Take 1 tablet (100 mg total) by mouth at bedtime.   No facility-administered encounter medications on file as of 04/05/2021.    Follow-up: Return in about 2 weeks (around 04/19/2021) for Physical Exam.   Noreene Larsson, NP

## 2021-04-05 NOTE — Assessment & Plan Note (Signed)
BP Readings from Last 3 Encounters:  04/05/21 (!) 188/138  05/05/19 (!) 158/118  01/21/19 (!) 129/92  -Rx. amlodipine -recheck in 2 weeks

## 2021-04-05 NOTE — Assessment & Plan Note (Signed)
-  obtain records 

## 2021-04-05 NOTE — Assessment & Plan Note (Addendum)
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

## 2021-04-05 NOTE — Assessment & Plan Note (Signed)
-  will check labs 

## 2021-04-06 ENCOUNTER — Other Ambulatory Visit (HOSPITAL_COMMUNITY): Payer: Self-pay

## 2021-04-21 LAB — CMP14+EGFR
ALT: 26 IU/L (ref 0–32)
AST: 22 IU/L (ref 0–40)
Albumin/Globulin Ratio: 1.7 (ref 1.2–2.2)
Albumin: 5 g/dL — ABNORMAL HIGH (ref 3.8–4.8)
Alkaline Phosphatase: 64 IU/L (ref 44–121)
BUN/Creatinine Ratio: 16 (ref 9–23)
BUN: 13 mg/dL (ref 6–20)
Bilirubin Total: 0.7 mg/dL (ref 0.0–1.2)
CO2: 25 mmol/L (ref 20–29)
Calcium: 9.4 mg/dL (ref 8.7–10.2)
Chloride: 98 mmol/L (ref 96–106)
Creatinine, Ser: 0.79 mg/dL (ref 0.57–1.00)
Globulin, Total: 2.9 g/dL (ref 1.5–4.5)
Glucose: 107 mg/dL — ABNORMAL HIGH (ref 65–99)
Potassium: 3.9 mmol/L (ref 3.5–5.2)
Sodium: 139 mmol/L (ref 134–144)
Total Protein: 7.9 g/dL (ref 6.0–8.5)
eGFR: 102 mL/min/{1.73_m2} (ref >59)

## 2021-04-21 LAB — TSH: TSH: 0.842 u[IU]/mL (ref 0.450–4.500)

## 2021-04-21 LAB — CBC WITH DIFFERENTIAL/PLATELET
Basophils Absolute: 0.1 10*3/uL (ref 0.0–0.2)
Basos: 1 %
EOS (ABSOLUTE): 0.1 10*3/uL (ref 0.0–0.4)
Eos: 2 %
Hematocrit: 43.9 % (ref 34.0–46.6)
Hemoglobin: 14.9 g/dL (ref 11.1–15.9)
Immature Grans (Abs): 0 10*3/uL (ref 0.0–0.1)
Immature Granulocytes: 0 %
Lymphocytes Absolute: 2.3 10*3/uL (ref 0.7–3.1)
Lymphs: 39 %
MCH: 30.2 pg (ref 26.6–33.0)
MCHC: 33.9 g/dL (ref 31.5–35.7)
MCV: 89 fL (ref 79–97)
Monocytes Absolute: 0.5 10*3/uL (ref 0.1–0.9)
Monocytes: 8 %
Neutrophils Absolute: 3 10*3/uL (ref 1.4–7.0)
Neutrophils: 50 %
Platelets: 371 10*3/uL (ref 150–450)
RBC: 4.93 x10E6/uL (ref 3.77–5.28)
RDW: 13 % (ref 11.7–15.4)
WBC: 5.9 10*3/uL (ref 3.4–10.8)

## 2021-04-21 LAB — LIPID PANEL WITH LDL/HDL RATIO
Cholesterol, Total: 235 mg/dL — ABNORMAL HIGH (ref 100–199)
HDL: 51 mg/dL
LDL Chol Calc (NIH): 166 mg/dL — ABNORMAL HIGH (ref 0–99)
LDL/HDL Ratio: 3.3 ratio — ABNORMAL HIGH (ref 0.0–3.2)
Triglycerides: 102 mg/dL (ref 0–149)
VLDL Cholesterol Cal: 18 mg/dL (ref 5–40)

## 2021-04-21 LAB — HEPATITIS C ANTIBODY: Hep C Virus Ab: 0.2 s/co ratio (ref 0.0–0.9)

## 2021-04-21 NOTE — Progress Notes (Signed)
LDL, or bad cholesterol, is elevated. She can try cutting out fried/fatty foods and getting more exercises. Since her LDL is 166, which is significantly higher than 100, she may need medication to decrease her cholesterol.  Let's get her on the books for an appointment in 6 months to recheck her labs (lipids, CMP).

## 2021-05-02 ENCOUNTER — Other Ambulatory Visit: Payer: Self-pay | Admitting: Nurse Practitioner

## 2021-05-02 ENCOUNTER — Encounter: Payer: Self-pay | Admitting: Nurse Practitioner

## 2021-05-02 DIAGNOSIS — Z139 Encounter for screening, unspecified: Secondary | ICD-10-CM

## 2021-05-02 NOTE — Progress Notes (Signed)
-  needs quantiferon gold TB testing for [facility]

## 2021-05-09 LAB — QUANTIFERON-TB GOLD PLUS
QuantiFERON Mitogen Value: >10 IU/mL
QuantiFERON Nil Value: 0.06 IU/mL
QuantiFERON TB1 Ag Value: 0.06 IU/mL
QuantiFERON TB2 Ag Value: 0.07 IU/mL
QuantiFERON-TB Gold Plus: NEGATIVE

## 2021-05-09 NOTE — Progress Notes (Signed)
Good news.Marland KitchenMarland KitchenQuantiferon Gold is negative!

## 2021-05-09 NOTE — Progress Notes (Signed)
Pt informed

## 2021-05-10 ENCOUNTER — Other Ambulatory Visit: Payer: Self-pay | Admitting: Nurse Practitioner

## 2021-05-10 DIAGNOSIS — Z139 Encounter for screening, unspecified: Secondary | ICD-10-CM

## 2021-05-10 NOTE — Progress Notes (Signed)
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

## 2021-05-11 ENCOUNTER — Other Ambulatory Visit (HOSPITAL_COMMUNITY): Payer: Self-pay

## 2021-05-13 ENCOUNTER — Other Ambulatory Visit: Payer: Self-pay | Admitting: Nurse Practitioner

## 2021-05-13 LAB — HEPATITIS B CORE AB W/REFLEX: Hep B Core Total Ab: NEGATIVE

## 2021-05-13 NOTE — Progress Notes (Signed)
Adding Hep B surface antigen... call after those result.

## 2021-05-17 ENCOUNTER — Encounter: Payer: Self-pay | Admitting: Nurse Practitioner

## 2021-05-18 LAB — SPECIMEN STATUS REPORT

## 2021-05-18 LAB — HEPATITIS B SURFACE ANTIBODY, QUANTITATIVE: Hepatitis B Surf Ab Quant: 142.5 m[IU]/mL (ref >9.9)

## 2021-05-18 NOTE — Progress Notes (Signed)
She has Hep B immunity.

## 2021-05-18 NOTE — Telephone Encounter (Signed)
You should be getting a call from our office to discuss this. Let me know if you have not heard anything towards the end of the day. Thank you for using MyChart! Have a blessed day!

## 2021-06-20 ENCOUNTER — Ambulatory Visit: Payer: No Typology Code available for payment source | Admitting: Physician Assistant

## 2021-07-01 ENCOUNTER — Other Ambulatory Visit: Payer: Self-pay | Admitting: Nurse Practitioner

## 2021-07-01 DIAGNOSIS — I10 Essential (primary) hypertension: Secondary | ICD-10-CM

## 2021-07-04 ENCOUNTER — Other Ambulatory Visit (HOSPITAL_COMMUNITY): Payer: Self-pay

## 2021-07-04 MED ORDER — AMLODIPINE BESYLATE 5 MG PO TABS
5.0000 mg | ORAL_TABLET | Freq: Every day | ORAL | 1 refills | Status: DC
  Filled 2021-07-04: qty 30, 30d supply, fill #0
  Filled 2021-08-15: qty 30, 30d supply, fill #1

## 2021-07-25 ENCOUNTER — Other Ambulatory Visit (HOSPITAL_COMMUNITY): Payer: Self-pay

## 2021-07-25 MED ORDER — NORETHIN ACE-ETH ESTRAD-FE 1-20 MG-MCG PO TABS
1.0000 | ORAL_TABLET | Freq: Every day | ORAL | 11 refills | Status: DC
  Filled 2021-07-25: qty 28, 28d supply, fill #0
  Filled 2021-08-15: qty 28, 28d supply, fill #1
  Filled 2021-09-12: qty 28, 28d supply, fill #2
  Filled 2021-10-03: qty 28, 28d supply, fill #3
  Filled 2021-11-15: qty 28, 28d supply, fill #4
  Filled 2021-12-19: qty 28, 28d supply, fill #5
  Filled 2022-02-01: qty 28, 28d supply, fill #6
  Filled 2022-02-21 – 2022-04-21 (×2): qty 28, 28d supply, fill #7
  Filled 2022-06-27: qty 28, 28d supply, fill #8

## 2021-07-25 MED ORDER — METFORMIN HCL 500 MG PO TABS
500.0000 mg | ORAL_TABLET | Freq: Two times a day (BID) | ORAL | 11 refills | Status: DC
  Filled 2021-07-25: qty 60, 30d supply, fill #0
  Filled 2021-09-12: qty 60, 30d supply, fill #1
  Filled 2022-02-21: qty 60, 30d supply, fill #2
  Filled 2022-04-21: qty 60, 30d supply, fill #3

## 2021-07-26 ENCOUNTER — Other Ambulatory Visit (HOSPITAL_COMMUNITY): Payer: Self-pay

## 2021-07-26 MED ORDER — DOXYCYCLINE HYCLATE 100 MG PO CAPS
100.0000 mg | ORAL_CAPSULE | Freq: Every day | ORAL | 2 refills | Status: DC
  Filled 2021-07-26: qty 30, 30d supply, fill #0

## 2021-07-26 MED ORDER — TRETINOIN 0.1 % EX CREA
TOPICAL_CREAM | Freq: Every day | CUTANEOUS | 2 refills | Status: DC
  Filled 2021-07-26 – 2022-01-11 (×5): qty 45, 30d supply, fill #0

## 2021-07-29 ENCOUNTER — Other Ambulatory Visit (HOSPITAL_COMMUNITY): Payer: Self-pay

## 2021-08-15 ENCOUNTER — Other Ambulatory Visit (HOSPITAL_COMMUNITY): Payer: Self-pay

## 2021-08-16 ENCOUNTER — Other Ambulatory Visit (HOSPITAL_COMMUNITY): Payer: Self-pay

## 2021-09-12 ENCOUNTER — Other Ambulatory Visit: Payer: Self-pay | Admitting: Nurse Practitioner

## 2021-09-12 ENCOUNTER — Other Ambulatory Visit (HOSPITAL_COMMUNITY): Payer: Self-pay

## 2021-09-12 DIAGNOSIS — I10 Essential (primary) hypertension: Secondary | ICD-10-CM

## 2021-09-12 MED ORDER — AMLODIPINE BESYLATE 5 MG PO TABS
5.0000 mg | ORAL_TABLET | Freq: Every day | ORAL | 1 refills | Status: DC
  Filled 2021-09-12: qty 30, 30d supply, fill #0
  Filled 2022-01-11: qty 30, 30d supply, fill #1

## 2021-09-27 ENCOUNTER — Other Ambulatory Visit (HOSPITAL_COMMUNITY): Payer: Self-pay

## 2021-10-04 ENCOUNTER — Other Ambulatory Visit (HOSPITAL_COMMUNITY): Payer: Self-pay

## 2021-10-10 ENCOUNTER — Other Ambulatory Visit (HOSPITAL_COMMUNITY): Payer: Self-pay

## 2021-10-10 MED ORDER — AMOXICILLIN 875 MG PO TABS
875.0000 mg | ORAL_TABLET | Freq: Two times a day (BID) | ORAL | 0 refills | Status: AC
  Filled 2021-10-10: qty 20, 10d supply, fill #0

## 2021-10-11 ENCOUNTER — Other Ambulatory Visit (HOSPITAL_COMMUNITY): Payer: Self-pay

## 2021-10-25 ENCOUNTER — Ambulatory Visit: Payer: No Typology Code available for payment source | Admitting: Dermatology

## 2021-10-26 ENCOUNTER — Other Ambulatory Visit (HOSPITAL_COMMUNITY): Payer: Self-pay

## 2021-10-26 MED ORDER — DOXYCYCLINE HYCLATE 100 MG PO CAPS
100.0000 mg | ORAL_CAPSULE | Freq: Every day | ORAL | 2 refills | Status: DC
  Filled 2021-10-26: qty 30, 30d supply, fill #0
  Filled 2021-12-19: qty 30, 30d supply, fill #1

## 2021-10-26 MED ORDER — SPIRONOLACTONE 50 MG PO TABS
50.0000 mg | ORAL_TABLET | Freq: Every day | ORAL | 2 refills | Status: DC
  Filled 2021-10-26: qty 30, 30d supply, fill #0
  Filled 2022-01-09: qty 30, 30d supply, fill #1
  Filled 2022-02-21: qty 30, 30d supply, fill #2

## 2021-10-31 ENCOUNTER — Ambulatory Visit: Payer: No Typology Code available for payment source | Admitting: Nurse Practitioner

## 2021-11-15 ENCOUNTER — Other Ambulatory Visit (HOSPITAL_COMMUNITY): Payer: Self-pay

## 2021-12-19 ENCOUNTER — Other Ambulatory Visit (HOSPITAL_COMMUNITY): Payer: Self-pay

## 2022-01-08 ENCOUNTER — Other Ambulatory Visit: Payer: Self-pay

## 2022-01-08 ENCOUNTER — Encounter (HOSPITAL_BASED_OUTPATIENT_CLINIC_OR_DEPARTMENT_OTHER): Payer: Self-pay | Admitting: Emergency Medicine

## 2022-01-08 ENCOUNTER — Emergency Department (HOSPITAL_BASED_OUTPATIENT_CLINIC_OR_DEPARTMENT_OTHER): Payer: PRIVATE HEALTH INSURANCE

## 2022-01-08 ENCOUNTER — Emergency Department (HOSPITAL_BASED_OUTPATIENT_CLINIC_OR_DEPARTMENT_OTHER)
Admission: EM | Admit: 2022-01-08 | Discharge: 2022-01-09 | Disposition: A | Discharge status: Home / Self Care | Payer: PRIVATE HEALTH INSURANCE | Attending: Emergency Medicine | Admitting: Emergency Medicine

## 2022-01-08 DIAGNOSIS — H5713 Ocular pain, bilateral: Secondary | ICD-10-CM | POA: Insufficient documentation

## 2022-01-08 DIAGNOSIS — J3489 Other specified disorders of nose and nasal sinuses: Secondary | ICD-10-CM | POA: Insufficient documentation

## 2022-01-08 DIAGNOSIS — M542 Cervicalgia: Secondary | ICD-10-CM | POA: Diagnosis not present

## 2022-01-08 DIAGNOSIS — S0990XA Unspecified injury of head, initial encounter: Secondary | ICD-10-CM | POA: Diagnosis present

## 2022-01-08 DIAGNOSIS — Y990 Civilian activity done for income or pay: Secondary | ICD-10-CM | POA: Insufficient documentation

## 2022-01-08 DIAGNOSIS — S060X0A Concussion without loss of consciousness, initial encounter: Secondary | ICD-10-CM | POA: Diagnosis not present

## 2022-01-08 DIAGNOSIS — Z7984 Long term (current) use of oral hypoglycemic drugs: Secondary | ICD-10-CM | POA: Diagnosis not present

## 2022-01-08 DIAGNOSIS — Z79899 Other long term (current) drug therapy: Secondary | ICD-10-CM | POA: Insufficient documentation

## 2022-01-08 MED ORDER — KETOROLAC TROMETHAMINE 30 MG/ML IJ SOLN
30.0000 mg | Freq: Once | INTRAMUSCULAR | Status: AC
  Administered 2022-01-08: 30 mg via INTRAMUSCULAR
  Filled 2022-01-08: qty 1

## 2022-01-08 MED ORDER — ONDANSETRON 4 MG PO TBDP
4.0000 mg | ORAL_TABLET | Freq: Once | ORAL | Status: AC
  Administered 2022-01-09: 4 mg via ORAL
  Filled 2022-01-08: qty 1

## 2022-01-08 NOTE — ED Notes (Signed)
Patient transported to CT 
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
## 2022-01-09 ENCOUNTER — Other Ambulatory Visit (HOSPITAL_COMMUNITY): Payer: Self-pay
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
## 2022-01-09 NOTE — ED Notes (Signed)
Pt reports that her BP is always elevated when at MD, she reports white coat syndrome and states that she has a BP cuff at home and her BP is fine at home.  Advised to keep an eye on this given BP readings here in ED ?

## 2022-01-11 ENCOUNTER — Other Ambulatory Visit: Payer: Self-pay | Admitting: Family Medicine

## 2022-01-11 ENCOUNTER — Ambulatory Visit: Payer: Self-pay

## 2022-01-11 ENCOUNTER — Other Ambulatory Visit (HOSPITAL_COMMUNITY): Payer: Self-pay

## 2022-01-11 DIAGNOSIS — S0033XA Contusion of nose, initial encounter: Secondary | ICD-10-CM

## 2022-01-20 ENCOUNTER — Other Ambulatory Visit (HOSPITAL_COMMUNITY): Payer: Self-pay

## 2022-01-20 ENCOUNTER — Encounter: Payer: Self-pay | Admitting: Physician Assistant

## 2022-01-20 ENCOUNTER — Ambulatory Visit (INDEPENDENT_AMBULATORY_CARE_PROVIDER_SITE_OTHER): Payer: No Typology Code available for payment source | Admitting: Physician Assistant

## 2022-01-20 VITALS — BP 137/94 | HR 95 | Temp 98.3°F | Ht 63.0 in | Wt 206.4 lb

## 2022-01-20 DIAGNOSIS — J3489 Other specified disorders of nose and nasal sinuses: Secondary | ICD-10-CM

## 2022-01-20 DIAGNOSIS — I10 Essential (primary) hypertension: Secondary | ICD-10-CM | POA: Diagnosis not present

## 2022-01-20 MED ORDER — HYDROCHLOROTHIAZIDE 12.5 MG PO TABS
12.5000 mg | ORAL_TABLET | Freq: Every day | ORAL | 0 refills | Status: DC
  Filled 2022-01-20: qty 30, 30d supply, fill #0

## 2022-01-20 NOTE — Patient Instructions (Signed)
Good to meet you today! ? ?Referral to ENT placed. ? ?Monitor BP at home. MyChart in a few weeks to let me know how your readings are doing. ?Continue Amlodipine 5 mg ?Add HCTZ 12.5 mg  ? ?Keep up good work with health goals!  ?
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
## 2022-02-01 ENCOUNTER — Other Ambulatory Visit (HOSPITAL_COMMUNITY): Payer: Self-pay

## 2022-02-08 ENCOUNTER — Other Ambulatory Visit (HOSPITAL_COMMUNITY): Payer: Self-pay

## 2022-02-08 MED ORDER — FLUTICASONE PROPIONATE 50 MCG/ACT NA SUSP
2.0000 | Freq: Every day | NASAL | 11 refills | Status: DC
  Filled 2022-02-08: qty 16, 30d supply, fill #0

## 2022-02-21 ENCOUNTER — Other Ambulatory Visit (HOSPITAL_COMMUNITY): Payer: Self-pay

## 2022-02-21 ENCOUNTER — Other Ambulatory Visit: Payer: Self-pay | Admitting: Physician Assistant

## 2022-02-21 ENCOUNTER — Other Ambulatory Visit: Payer: Self-pay

## 2022-02-21 MED ORDER — HYDROCHLOROTHIAZIDE 12.5 MG PO TABS
12.5000 mg | ORAL_TABLET | Freq: Every day | ORAL | 0 refills | Status: DC
  Filled 2022-02-21: qty 30, 30d supply, fill #0

## 2022-02-22 ENCOUNTER — Other Ambulatory Visit (HOSPITAL_COMMUNITY): Payer: Self-pay

## 2022-02-22 ENCOUNTER — Other Ambulatory Visit: Payer: Self-pay | Admitting: Nurse Practitioner

## 2022-02-22 DIAGNOSIS — I10 Essential (primary) hypertension: Secondary | ICD-10-CM

## 2022-02-23 ENCOUNTER — Other Ambulatory Visit (HOSPITAL_COMMUNITY): Payer: Self-pay

## 2022-03-03 ENCOUNTER — Ambulatory Visit: Payer: PRIVATE HEALTH INSURANCE | Attending: Family Medicine

## 2022-03-03 DIAGNOSIS — R2681 Unsteadiness on feet: Secondary | ICD-10-CM | POA: Diagnosis not present

## 2022-03-03 DIAGNOSIS — R42 Dizziness and giddiness: Secondary | ICD-10-CM | POA: Diagnosis present
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
## 2022-03-07 ENCOUNTER — Encounter: Payer: No Typology Code available for payment source | Admitting: Physician Assistant

## 2022-03-08 ENCOUNTER — Ambulatory Visit: Payer: PRIVATE HEALTH INSURANCE

## 2022-03-08 DIAGNOSIS — R42 Dizziness and giddiness: Secondary | ICD-10-CM

## 2022-03-08 DIAGNOSIS — R2681 Unsteadiness on feet: Secondary | ICD-10-CM

## 2022-03-08 NOTE — Therapy (Signed)
OUTPATIENT PHYSICAL THERAPY VESTIBULAR TREATMENT NOTE   Patient Name: Jennifer Solis MRN: [id number] DOB:[birth year], 31 y.o., female Today's Date: 03/08/2022  PCP: Theresa Duty, PA-C REFERRING PROVIDER: Odis Luster, MD   PT End of Session - 03/08/22 1527     Visit Number 2    Number of Visits 9    Date for PT Re-Evaluation 05/05/22    Authorization Type Workers Comp (Eval + 8 Visits Approved)    PT Start Time 1530    PT Stop Time 1610   ended early due to symptoms   PT Time Calculation (min) 40 min    Activity Tolerance Patient tolerated treatment well;Other (comment)   limited due to dizziness/nausea   Behavior During Therapy [facility] for tasks assessed/performed             Past Medical History:  Diagnosis Date   ADHD    Migraines    Polycystic ovarian syndrome    TBI (traumatic brain injury) ([place]) 2012   with skull fracture after fall    Past Surgical History:  Procedure Laterality Date   KNEE SURGERY Left    RHINOPLASTY  2011   Patient Active Problem List   Diagnosis Date Noted   Encounter to establish care 04/05/2021   Screening due 04/05/2021   HTN (hypertension) 04/05/2021   Hyperpigmentation 04/05/2021   Encounter for general adult medical examination with abnormal findings 04/05/2021   Deviated septum 08/22/2018   Mixed obsessional thoughts and acts 08/22/2018   OCD (obsessive compulsive disorder) 08/20/2018   ADHD 08/20/2018   IBS (irritable bowel syndrome) - D 05/14/2018   Allergic rhinitis 12/27/2015   GERD (gastroesophageal reflux disease) 12/27/2015   Hyperlipidemia 12/27/2015   Migraine 12/27/2015   PCOS (polycystic ovarian syndrome) 12/27/2015    ONSET DATE: 02/20/22  REFERRING DIAG: [id number] (ICD-10-CM) - Postconcussional syndrome  THERAPY DIAG:  Unsteadiness on feet  Dizziness and giddiness  Rationale for Evaluation and Treatment Rehabilitation  PERTINENT HISTORY: Hx of TBI (2012), Migraines, ADHD  PRECAUTIONS: None  SUBJECTIVE:  Patient reports that after initial evaluation felt some residual symptoms throughout the rest day, but did resolve by next day. Patient reports some mild dizziness. Has worked four days straight, has been very busy.   PAIN:  Are you having pain? No   OBJECTIVE:  Neuro re-ed: sensory organization test performed with following results: Conditions: 1: 1st and 2nd above age related norm, 49rd below age related norm 2: all above age related norm 3: all above age related norm  4: all above age related norm 24: 1st below age related norm, 2nd below age related norm 10: all above age related norm Composite score: 76% (WNL)  Sensory Analysis Som: WNL Vis: WNL Vest: WNL Pref: WNL Strategy analysis: ankle dominant       COG alignment: posterior COG (increased posterior postural sway       Patient reports that dizziness is 10/10 after completion of SOT. Reports that most challenging and symptomatic was Condition 4 (EC and Support Moving). Mild nausea.    Gaze Stabilization:  VOR x 1 Horizontal: x 30 seconds; moderate dizziness, cues for technique VOR x 1 Vertical: x 30 seconds; moderate dizziness, cues for technique   PATIENT EDUCATION: Education details: SOT results Person educated: Patient Education method: Explanation Education comprehension: verbalized understanding     GOALS: Goals reviewed with patient? Yes   SHORT TERM GOALS: Target date: 04/07/2022    Pt will be independent with initial HEP for improved balance and vestibular function  Baseline: HEP to be established Goal status: INITIAL   2.  FGA/SOT to be further assessed and LTG to be set as applicable Baseline: TBA Goal status: INITIAL   3.  Pt will report </= 3/5 for all movements on MSQ to indicate improvement in motion sensitivity and improved activity tolerance.  Baseline: 3-4/5 Goal status: INITIAL   4.  Pt will be able to tolerate approx 30 seconds of VOR with moderate dizziness Baseline: unable to tolerate   Goal status: INITIAL     LONG TERM GOALS: Target date: 05/05/2022     Pt will be independent with final HEP for improved balance and vestibular function  Baseline: HEP to be established Goal status: INITIAL   2.  Pt will improve FGA by 4 points from baseline to demonstrate improved balance and reduced fall risk Baseline: TBA Goal status: INITIAL   3.  Pt will report </= 1/5 for all movements on MSQ to indicate improvement in motion sensitivity and improved activity tolerance.  Baseline: 3-4/5 Goal status: INITIAL   4.  Pt will be able to tolerate approx 60 seconds of VOR with mild symptoms      Baseline: unable to tolerate Goal status: INITIAL   5.  Pt will improve DPS to >/= 62% Baseline: 48% Goal status: INITIAL   6. LTG to be set for SOT as applicable Baseline: TBA; SOT Results WNL Goal status: DEFERRED     ASSESSMENT:   CLINICAL IMPRESSION: Completed further balance assessment with SOT with patient demonstrating all sensory components WNL and composite scire of 76% which is above age related norms. Despite normal findings, patient experiencing significant mod-severe dizziness symptoms with mild nausea. Required extended rest break. Will continue per POC.    OBJECTIVE IMPAIRMENTS decreased activity tolerance, decreased balance, difficulty walking, dizziness, postural dysfunction, and pain.    ACTIVITY LIMITATIONS lifting, bending, stairs, and reach over head   PARTICIPATION LIMITATIONS: community activity, occupation, and yard work   PERSONAL FACTORS 1-2 comorbidities: Hx of TBI (2012), Migraines, ADHD  are also affecting patient's functional outcome.    REHAB POTENTIAL: Good   CLINICAL DECISION MAKING: Stable/uncomplicated   EVALUATION COMPLEXITY: Low     PLAN: PT FREQUENCY: 1x/week   PT DURATION: 8 weeks   PLANNED INTERVENTIONS: Therapeutic exercises, Therapeutic activity, Neuromuscular re-education, Balance training, Gait training, Patient/Family  education, Joint manipulation, Joint mobilization, Stair training, Vestibular training, Canalith repositioning, DME instructions, Aquatic Therapy, Dry Needling, Spinal mobilization, Cryotherapy, Moist heat, and Manual therapy   PLAN FOR NEXT SESSION: Assess FGA, Update LTGs. Initiate HEP focused on VOR and head turns/nods for habituation. Will need to add balance to HEP upon results of testing.    Jones Bales, PT, DPT 03/08/2022, 4:15 PM

## 2022-03-09 ENCOUNTER — Encounter: Payer: Self-pay | Admitting: Physician Assistant

## 2022-03-09 NOTE — Telephone Encounter (Signed)
Please see pt response and advise 

## 2022-03-09 NOTE — Telephone Encounter (Signed)
Please see note and advise  

## 2022-03-10 ENCOUNTER — Other Ambulatory Visit: Payer: Self-pay

## 2022-03-13 ENCOUNTER — Ambulatory Visit: Payer: PRIVATE HEALTH INSURANCE | Admitting: Family Medicine

## 2022-03-14 ENCOUNTER — Ambulatory Visit: Payer: PRIVATE HEALTH INSURANCE | Attending: Family Medicine

## 2022-03-14 DIAGNOSIS — R2681 Unsteadiness on feet: Secondary | ICD-10-CM | POA: Diagnosis present

## 2022-03-14 DIAGNOSIS — R42 Dizziness and giddiness: Secondary | ICD-10-CM | POA: Diagnosis present

## 2022-03-14 NOTE — Therapy (Signed)
OUTPATIENT PHYSICAL THERAPY VESTIBULAR TREATMENT NOTE   Patient Name: Jennifer Solis MRN: [id number] DOB:01/03/1991, 31 y.o., female Today's Date: 03/14/2022  PCP: Ila Mcgill, PA-C REFERRING PROVIDER: Lanell Persons, MD   PT End of Session - 03/14/22 1446     Visit Number 3    Number of Visits 9    Date for PT Re-Evaluation 05/05/22    Authorization Type Workers Comp (Eval + 8 Visits Approved)    PT Start Time 1446    PT Stop Time 1530    PT Time Calculation (min) 44 min    Activity Tolerance Patient tolerated treatment well;Other (comment)   limited due to dizziness/nausea   Behavior During Therapy [facility] for tasks assessed/performed              Past Medical History:  Diagnosis Date   ADHD    Migraines    Polycystic ovarian syndrome    TBI (traumatic brain injury) ([facility]) 2012   with skull fracture after fall    Past Surgical History:  Procedure Laterality Date   KNEE SURGERY Left    RHINOPLASTY  2011   Patient Active Problem List   Diagnosis Date Noted   Encounter to establish care 04/05/2021   Screening due 04/05/2021   HTN (hypertension) 04/05/2021   Hyperpigmentation 04/05/2021   Encounter for general adult medical examination with abnormal findings 04/05/2021   Deviated septum 08/22/2018   Mixed obsessional thoughts and acts 08/22/2018   OCD (obsessive compulsive disorder) 08/20/2018   ADHD 08/20/2018   IBS (irritable bowel syndrome) - D 05/14/2018   Allergic rhinitis 12/27/2015   GERD (gastroesophageal reflux disease) 12/27/2015   Hyperlipidemia 12/27/2015   Migraine 12/27/2015   PCOS (polycystic ovarian syndrome) 12/27/2015    ONSET DATE: 02/20/22  REFERRING DIAG: J67.34 (ICD-10-CM) - Postconcussional syndrome  THERAPY DIAG:  Unsteadiness on feet  Dizziness and giddiness  Rationale for Evaluation and Treatment Rehabilitation  PERTINENT HISTORY: Hx of TBI (2012), Migraines, ADHD  PRECAUTIONS: None  SUBJECTIVE: Patient reports no  dizziness currently, none dizziness. Mild dizziness over the weekend, but reports was very busy at work. No Has. No other new changes.   PAIN:  Are you having pain? No   OBJECTIVE:   [facility] PT Assessment - 03/14/22 0001       Functional Gait  Assessment   Gait assessed  Yes    Gait Level Surface Walks 20 ft in less than 5.5 sec, no assistive devices, good speed, no evidence for imbalance, normal gait pattern, deviates no more than 6 in outside of the 12 in walkway width.    Change in Gait Speed Able to smoothly change walking speed without loss of balance or gait deviation. Deviate no more than 6 in outside of the 12 in walkway width.    Gait with Horizontal Head Turns Performs head turns smoothly with slight change in gait velocity (eg, minor disruption to smooth gait path), deviates 6-10 in outside 12 in walkway width, or uses an assistive device.   dizziness   Gait with Vertical Head Turns Performs task with slight change in gait velocity (eg, minor disruption to smooth gait path), deviates 6 - 10 in outside 12 in walkway width or uses assistive device   dizziness   Gait and Pivot Turn Pivot turns safely within 3 sec and stops quickly with no loss of balance.   mild dizziness   Step Over Obstacle Is able to step over 2 stacked shoe boxes taped together (9 in total height) without  changing gait speed. No evidence of imbalance.    Gait with Narrow Base of Support Ambulates 7-9 steps.    Gait with Eyes Closed Walks 20 ft, uses assistive device, slower speed, mild gait deviations, deviates 6-10 in outside 12 in walkway width. Ambulates 20 ft in less than 9 sec but greater than 7 sec.   moderate dizziness   Ambulating Backwards Walks 20 ft, uses assistive device, slower speed, mild gait deviations, deviates 6-10 in outside 12 in walkway width.    Steps Alternating feet, no rail.    Total Score 25    FGA comment: 25/30            Standing Balance: Surface: Airex Position: Feet Hip Width  Apart Completed with: Eyes Open; Head Turns x 10 Reps and Head Nods x 10 Reps; then completed eyes closed 3 x 30 seconds. Then trialed eyes closed with feet hip width apart and horizontal/vertical head turns. Mod challenge noted with increased postural sway. Added to HEP. See Below  Access Code: [email or web address] URL: [email or web address] Date: 03/14/2022 Prepared by: Jethro Bastos  Exercises - Standing Balance with Eyes Closed on Pillow  - 1 x daily - 5 x weekly - 1 sets - 3 reps - 30 seconds hold - Standing Feet Apart with Eyes Closed and Head Nods on Foam Pad  - 1 x daily - 5 x weekly - 2 sets - 5 reps - Standing Feet Apart with Eyes Closed and Head Rotation on Foam Pad  - 1 x daily - 5 x weekly - 2 sets - 5 reps     Gaze Stabilization:  VOR x 1 Horizontal: x 30 seconds; moderate dizziness (5-6/10), x 2 reps, cues for range required VOR x 1 Vertical: x 30 seconds; moderate dizziness (6-7/10) and reports body feels like it is in motion (feels like she has postural sway),  x 2 reps, cues for technique/range.  Added to HEP:  Gaze Stabilization: Sitting    Keeping eyes on target on wall 2-3 feet away, tilt head down 15-30 and move head side to side for 30 seconds. Repeat while moving head up and down for 30 seconds. Do 2 sessions per day.    PATIENT EDUCATION: Education details: Initial HEP Person educated: Patient Education method: Explanation Education comprehension: verbalized understanding     GOALS: Goals reviewed with patient? Yes   SHORT TERM GOALS: Target date: 04/07/2022    Pt will be independent with initial HEP for improved balance and vestibular function  Baseline: HEP to be established Goal status: INITIAL   2.  FGA/SOT to be further assessed and LTG to be set as applicable Baseline: TBA Goal status: INITIAL   3.  Pt will report </= 3/5 for all movements on MSQ to indicate improvement in motion sensitivity and improved activity tolerance.  Baseline:  3-4/5 Goal status: INITIAL   4.  Pt will be able to tolerate approx 30 seconds of VOR with moderate dizziness Baseline: unable to tolerate  Goal status: INITIAL     LONG TERM GOALS: Target date: 05/05/2022     Pt will be independent with final HEP for improved balance and vestibular function  Baseline: HEP to be established Goal status: INITIAL   2.  Pt will improve FGA by 4 points from baseline to demonstrate improved balance and reduced fall risk Baseline: TBA Goal status: INITIAL   3.  Pt will report </= 1/5 for all movements on MSQ to indicate improvement in motion sensitivity and improved activity  tolerance.  Baseline: 3-4/5 Goal status: INITIAL   4.  Pt will be able to tolerate approx 60 seconds of VOR with mild symptoms      Baseline: unable to tolerate Goal status: INITIAL   5.  Pt will improve DPS to >/= 62% Baseline: 48% Goal status: INITIAL   6. LTG to be set for SOT as applicable Baseline: TBA; SOT Results WNL Goal status: DEFERRED     ASSESSMENT:   CLINICAL IMPRESSION: Completed further balance assessment with FGA, patient scoring 25/30. Most challenge noted with. Rest of session spent establishing initial HEP focused on VOR and standing balance. Patient tolerating well with intermittent rest breaks. Mod dizziness intermittently but improved with seated rest break. Will continue per POC.    OBJECTIVE IMPAIRMENTS decreased activity tolerance, decreased balance, difficulty walking, dizziness, postural dysfunction, and pain.    ACTIVITY LIMITATIONS lifting, bending, stairs, and reach over head   PARTICIPATION LIMITATIONS: community activity, occupation, and yard work   PERSONAL FACTORS 1-2 comorbidities: Hx of TBI (2012), Migraines, ADHD  are also affecting patient's functional outcome.    REHAB POTENTIAL: Good   CLINICAL DECISION MAKING: Stable/uncomplicated   EVALUATION COMPLEXITY: Low     PLAN: PT FREQUENCY: 1x/week   PT DURATION: 8 weeks    PLANNED INTERVENTIONS: Therapeutic exercises, Therapeutic activity, Neuromuscular re-education, Balance training, Gait training, Patient/Family education, Joint manipulation, Joint mobilization, Stair training, Vestibular training, Canalith repositioning, DME instructions, Aquatic Therapy, Dry Needling, Spinal mobilization, Cryotherapy, Moist heat, and Manual therapy   PLAN FOR NEXT SESSION: Continue VOR, Habituation, and Balance. Focus on Head Turns. Motion Sensitivity and High Level Balance.   Tempie DonningKaitlyn B Wilkie Zenon, PT, DPT 03/14/2022, 3:35 PM

## 2022-03-14 NOTE — Patient Instructions (Signed)
Gaze Stabilization: Sitting    Keeping eyes on target on wall 2-3 feet away, tilt head down 15-30 and move head side to side for 30 seconds. Repeat while moving head up and down for 30 seconds. Do 2 sessions per day.   Gaze Stabilization: Tip Card  1.Target must remain in focus, not blurry, and appear stationary while head is in motion. 2.Perform exercises with small head movements (45 to either side of midline). 3.Increase speed of head motion so long as target is in focus. 4.If you wear eyeglasses, be sure you can see target through lens (therapist will give specific instructions for bifocal / progressive lenses). 5.These exercises may provoke dizziness or nausea. Work through these symptoms. If too dizzy, slow head movement slightly. Rest between each exercise. 6.Exercises demand concentration; avoid distractions. 7.For safety, perform standing exercises close to a counter, wall, corner, or next to someone.  Copyright  VHI. All rights reserved.    

## 2022-03-15 ENCOUNTER — Other Ambulatory Visit (HOSPITAL_COMMUNITY): Payer: Self-pay

## 2022-03-22 ENCOUNTER — Ambulatory Visit: Payer: No Typology Code available for payment source | Admitting: Family Medicine

## 2022-03-27 ENCOUNTER — Ambulatory Visit: Payer: PRIVATE HEALTH INSURANCE

## 2022-03-27 ENCOUNTER — Other Ambulatory Visit: Payer: Self-pay

## 2022-03-27 ENCOUNTER — Other Ambulatory Visit (HOSPITAL_COMMUNITY): Payer: Self-pay

## 2022-03-27 DIAGNOSIS — R42 Dizziness and giddiness: Secondary | ICD-10-CM

## 2022-03-27 DIAGNOSIS — I10 Essential (primary) hypertension: Secondary | ICD-10-CM

## 2022-03-27 DIAGNOSIS — R2681 Unsteadiness on feet: Secondary | ICD-10-CM | POA: Diagnosis not present

## 2022-03-27 MED ORDER — AMLODIPINE BESYLATE 5 MG PO TABS
5.0000 mg | ORAL_TABLET | Freq: Every day | ORAL | 1 refills | Status: DC
  Filled 2022-03-27: qty 30, 30d supply, fill #0
  Filled 2022-04-21: qty 30, 30d supply, fill #1

## 2022-03-27 NOTE — Patient Instructions (Addendum)
Gaze Stabilization: Standing Feet Apart    Feet shoulder width apart, keeping eyes on target on wall 3-4 feet away, tilt head down 15-30 and move head side to side for 45 seconds. Repeat while moving head up and down for 45 seconds. Do 2-3 sessions per day.   Bending / Picking Up Objects    Sitting, slowly bend head down and pick up object on the floor. Return to upright position. Hold position until symptoms subside. Repeat 5 times from seated position. Then complete additional set standing. Rest break as needed to allow for symptom return to baseline.   Copyright  VHI. All rights reserved.

## 2022-03-27 NOTE — Therapy (Signed)
OUTPATIENT PHYSICAL THERAPY VESTIBULAR TREATMENT NOTE   Patient Name: Jennifer Solis MRN: [id number] DOB:13-Feb-1991, 31 y.o., female Today's Date: 03/27/2022  PCP: Ila Mcgill, PA-C REFERRING PROVIDER: Lanell Persons, MD   PT End of Session - 03/27/22 1316     Visit Number 4    Number of Visits 9    Date for PT Re-Evaluation 05/05/22    Authorization Type Workers Comp (Eval + 8 Visits Approved)    PT Start Time 1316    PT Stop Time 1400    PT Time Calculation (min) 44 min    Activity Tolerance Patient tolerated treatment well;Other (comment)   limited due to dizziness/nausea   Behavior During Therapy [facility] for tasks assessed/performed              Past Medical History:  Diagnosis Date   ADHD    Migraines    Polycystic ovarian syndrome    TBI (traumatic brain injury) ([facility]) 2012   with skull fracture after fall    Past Surgical History:  Procedure Laterality Date   KNEE SURGERY Left    RHINOPLASTY  2011   Patient Active Problem List   Diagnosis Date Noted   Encounter to establish care 04/05/2021   Screening due 04/05/2021   HTN (hypertension) 04/05/2021   Hyperpigmentation 04/05/2021   Encounter for general adult medical examination with abnormal findings 04/05/2021   Deviated septum 08/22/2018   Mixed obsessional thoughts and acts 08/22/2018   OCD (obsessive compulsive disorder) 08/20/2018   ADHD 08/20/2018   IBS (irritable bowel syndrome) - D 05/14/2018   Allergic rhinitis 12/27/2015   GERD (gastroesophageal reflux disease) 12/27/2015   Hyperlipidemia 12/27/2015   Migraine 12/27/2015   PCOS (polycystic ovarian syndrome) 12/27/2015    ONSET DATE: 02/20/22  REFERRING DIAG: S50.53 (ICD-10-CM) - Postconcussional syndrome  THERAPY DIAG:  Unsteadiness on feet  Dizziness and giddiness  Rationale for Evaluation and Treatment Rehabilitation  PERTINENT HISTORY: Hx of TBI (2012), Migraines, ADHD  PRECAUTIONS: None  SUBJECTIVE: Patient reports that the  trip went well to [place]. No other new changes. Reports that the dizziness has been a lot better. Did the exercises in the pool. No dizziness currently, did have some dizziness yesterday. Reports it is still mild.   PAIN:  Are you having pain? No   OBJECTIVE:   Standing Balance: Surface: Airex Position: Narrow Base of Support Completed with: Eyes Closed; Head Turns x 10 Reps and Head Nods x 10 Reps; then completed eyes closed 3 x 30 seconds with increased postural sway. Mod challenge noted with increased postural sway.  Rockerboard: Standing on Rockerboard positioned A/P: completed static standing with eyes open x 30 seconds, then progressing to addition of ant/post weight shifts x 10 reps to facilitate improved ankle strategy. Then transitioned to maintaining board steady with eyes closed, completed x 30 seconds. Then eyes open and horizontal/vertical head turns x 10 reps each direction. More challenge and increased symptoms reported with vertical > horizontal. Patient reports sensation of feeling as she is on a roller coaster.   Habituation: Forward Bending/Reaching to the Floor: from seated position completed bending to cone in various directions, completed 2 x 4 cones without rest break. Progressed to standing position, completed 2 x 4 cones with small rest break between sets. More dizziness reported in standing.     Gaze Stabilization:  VOR x 1 Horizontal: x 30 seconds, then x 50 seconds, moderate dizziness (5-7/10), x 2 reps. Increased dizziness with increased time.  VOR x 1 Vertical: x  30 seconds; x 45 seconds, moderate dizziness (5/10), reports some postural sway x 2 reps, cues for technique/range. Seated rest break required.   Updated HEP:  Gaze Stabilization: Standing Feet Apart    Feet shoulder width apart, keeping eyes on target on wall 3-4 feet away, tilt head down 15-30 and move head side to side for 45 seconds. Repeat while moving head up and down for 45 seconds. Do 2-3  sessions per day.   Bending / Picking Up Objects    Sitting, slowly bend head down and pick up object on the floor. Return to upright position. Hold position until symptoms subside. Repeat 5 times from seated position. Then complete additional set standing. Rest break as needed to allow for symptom return to baseline.   Copyright  VHI. All rights reserved.   PATIENT EDUCATION: Education details: Updated HEP Person educated: Patient Education method: Explanation Education comprehension: verbalized understanding     GOALS: Goals reviewed with patient? Yes   SHORT TERM GOALS: Target date: 04/07/2022    Pt will be independent with initial HEP for improved balance and vestibular function  Baseline: HEP to be established Goal status: INITIAL   2.  FGA/SOT to be further assessed and LTG to be set as applicable Baseline: TBA Goal status: INITIAL   3.  Pt will report </= 3/5 for all movements on MSQ to indicate improvement in motion sensitivity and improved activity tolerance.  Baseline: 3-4/5 Goal status: INITIAL   4.  Pt will be able to tolerate approx 30 seconds of VOR with moderate dizziness Baseline: unable to tolerate  Goal status: INITIAL     LONG TERM GOALS: Target date: 05/05/2022     Pt will be independent with final HEP for improved balance and vestibular function  Baseline: HEP to be established Goal status: INITIAL   2.  Pt will improve FGA by 4 points from baseline to demonstrate improved balance and reduced fall risk Baseline: TBA Goal status: INITIAL   3.  Pt will report </= 1/5 for all movements on MSQ to indicate improvement in motion sensitivity and improved activity tolerance.  Baseline: 3-4/5 Goal status: INITIAL   4.  Pt will be able to tolerate approx 60 seconds of VOR with mild symptoms      Baseline: unable to tolerate Goal status: INITIAL   5.  Pt will improve DPS to >/= 62% Baseline: 48% Goal status: INITIAL   6. LTG to be set for SOT as  applicable Baseline: TBA; SOT Results WNL Goal status: DEFERRED     ASSESSMENT:   CLINICAL IMPRESSION: Continued VOR progression today with patient tolerating standing and time of 45 seconds. Patient unable to tolerate 60 seconds today due to symptoms. Continued balance progression on rockerboard. Most symptomatic with vertical head movement. Will continue per POC.    OBJECTIVE IMPAIRMENTS decreased activity tolerance, decreased balance, difficulty walking, dizziness, postural dysfunction, and pain.    ACTIVITY LIMITATIONS lifting, bending, stairs, and reach over head   PARTICIPATION LIMITATIONS: community activity, occupation, and yard work   PERSONAL FACTORS 1-2 comorbidities: Hx of TBI (2012), Migraines, ADHD  are also affecting patient's functional outcome.    REHAB POTENTIAL: Good   CLINICAL DECISION MAKING: Stable/uncomplicated   EVALUATION COMPLEXITY: Low     PLAN: PT FREQUENCY: 1x/week   PT DURATION: 8 weeks   PLANNED INTERVENTIONS: Therapeutic exercises, Therapeutic activity, Neuromuscular re-education, Balance training, Gait training, Patient/Family education, Joint manipulation, Joint mobilization, Stair training, Vestibular training, Canalith repositioning, DME instructions, Aquatic Therapy,  Dry Needling, Spinal mobilization, Cryotherapy, Moist heat, and Manual therapy   PLAN FOR NEXT SESSION: Continue VOR, Habituation, and Balance. Focus on Head Turns. Motion Sensitivity and High Level Balance.   Tempie Donning, PT, DPT 03/27/2022, 2:01 PM

## 2022-04-04 ENCOUNTER — Ambulatory Visit: Payer: PRIVATE HEALTH INSURANCE

## 2022-04-04 DIAGNOSIS — R2681 Unsteadiness on feet: Secondary | ICD-10-CM | POA: Diagnosis not present

## 2022-04-04 DIAGNOSIS — R42 Dizziness and giddiness: Secondary | ICD-10-CM

## 2022-04-05 ENCOUNTER — Encounter: Payer: Self-pay | Admitting: Family Medicine

## 2022-04-05 ENCOUNTER — Ambulatory Visit (INDEPENDENT_AMBULATORY_CARE_PROVIDER_SITE_OTHER): Payer: PRIVATE HEALTH INSURANCE | Admitting: Family Medicine

## 2022-04-05 VITALS — BP 148/98 | Ht 63.0 in | Wt 200.0 lb

## 2022-04-05 DIAGNOSIS — S060X0A Concussion without loss of consciousness, initial encounter: Secondary | ICD-10-CM | POA: Diagnosis not present

## 2022-04-05 NOTE — Patient Instructions (Signed)
Do your home vestibular exercises daily as directed. Follow up with me in 6 weeks.
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
## 2022-04-14 ENCOUNTER — Ambulatory Visit (INDEPENDENT_AMBULATORY_CARE_PROVIDER_SITE_OTHER): Payer: No Typology Code available for payment source | Admitting: Physician Assistant

## 2022-04-14 ENCOUNTER — Encounter: Payer: Self-pay | Admitting: Physician Assistant

## 2022-04-14 VITALS — BP 152/94 | HR 91 | Temp 98.4°F | Ht 63.0 in | Wt 210.0 lb

## 2022-04-14 DIAGNOSIS — E282 Polycystic ovarian syndrome: Secondary | ICD-10-CM | POA: Diagnosis not present

## 2022-04-14 DIAGNOSIS — R1011 Right upper quadrant pain: Secondary | ICD-10-CM

## 2022-04-14 DIAGNOSIS — I10 Essential (primary) hypertension: Secondary | ICD-10-CM | POA: Diagnosis not present

## 2022-04-14 DIAGNOSIS — E559 Vitamin D deficiency, unspecified: Secondary | ICD-10-CM | POA: Diagnosis not present

## 2022-04-14 DIAGNOSIS — E7800 Pure hypercholesterolemia, unspecified: Secondary | ICD-10-CM

## 2022-04-14 DIAGNOSIS — R5383 Other fatigue: Secondary | ICD-10-CM

## 2022-04-14 LAB — COMPREHENSIVE METABOLIC PANEL
ALT: 22 U/L (ref 0–35)
AST: 15 U/L (ref 0–37)
Albumin: 4.8 g/dL (ref 3.5–5.2)
Alkaline Phosphatase: 63 U/L (ref 39–117)
BUN: 15 mg/dL (ref 6–23)
CO2: 30 mEq/L (ref 19–32)
Calcium: 10 mg/dL (ref 8.4–10.5)
Chloride: 99 mEq/L (ref 96–112)
Creatinine, Ser: 0.78 mg/dL (ref 0.40–1.20)
GFR: 99.9 mL/min (ref >60.00)
Glucose, Bld: 97 mg/dL (ref 70–99)
Potassium: 4.1 mEq/L (ref 3.5–5.1)
Sodium: 138 mEq/L (ref 135–145)
Total Bilirubin: 0.6 mg/dL (ref 0.2–1.2)
Total Protein: 7.7 g/dL (ref 6.0–8.3)

## 2022-04-14 LAB — TSH: TSH: 1.29 u[IU]/mL (ref 0.35–5.50)

## 2022-04-14 LAB — LIPASE: Lipase: 29 U/L (ref 11.0–59.0)

## 2022-04-14 LAB — VITAMIN D 25 HYDROXY (VIT D DEFICIENCY, FRACTURES): VITD: 30.43 ng/mL (ref 30.00–100.00)

## 2022-04-14 LAB — LIPID PANEL
Cholesterol: 218 mg/dL — ABNORMAL HIGH (ref 0–200)
HDL: 52.5 mg/dL (ref >39.00)
LDL Cholesterol: 137 mg/dL — ABNORMAL HIGH (ref 0–99)
NonHDL: 165.12
Total CHOL/HDL Ratio: 4
Triglycerides: 140 mg/dL (ref 0.0–149.0)
VLDL: 28 mg/dL (ref 0.0–40.0)

## 2022-04-14 LAB — CBC WITH DIFFERENTIAL/PLATELET
Basophils Absolute: 0 10*3/uL (ref 0.0–0.1)
Basophils Relative: 0.7 % (ref 0.0–3.0)
Eosinophils Absolute: 0.1 10*3/uL (ref 0.0–0.7)
Eosinophils Relative: 2.2 % (ref 0.0–5.0)
HCT: 43.6 % (ref 36.0–46.0)
Hemoglobin: 14.5 g/dL (ref 12.0–15.0)
Lymphocytes Relative: 43.4 % (ref 12.0–46.0)
Lymphs Abs: 2.6 10*3/uL (ref 0.7–4.0)
MCHC: 33.3 g/dL (ref 30.0–36.0)
MCV: 92.2 fl (ref 78.0–100.0)
Monocytes Absolute: 0.5 10*3/uL (ref 0.1–1.0)
Monocytes Relative: 8.2 % (ref 3.0–12.0)
Neutro Abs: 2.7 10*3/uL (ref 1.4–7.7)
Neutrophils Relative %: 45.5 % (ref 43.0–77.0)
Platelets: 320 10*3/uL (ref 150.0–400.0)
RBC: 4.72 Mil/uL (ref 3.87–5.11)
RDW: 13.2 % (ref 11.5–15.5)
WBC: 6 10*3/uL (ref 4.0–10.5)

## 2022-04-14 LAB — HEMOGLOBIN A1C: Hgb A1c MFr Bld: 6 % (ref 4.6–6.5)

## 2022-04-14 LAB — VITAMIN B12: Vitamin B-12: 335 pg/mL (ref 211–911)

## 2022-04-14 NOTE — Patient Instructions (Signed)
Good to see you again! Labs today Keep working on health goals [place] RUQ, suspect possible gallbladder issue Track BP at home  ED if acute severe symptoms
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
## 2022-04-21 ENCOUNTER — Other Ambulatory Visit (HOSPITAL_COMMUNITY): Payer: Self-pay

## 2022-04-21 ENCOUNTER — Ambulatory Visit
Admission: RE | Admit: 2022-04-21 | Discharge: 2022-04-21 | Disposition: A | Discharge status: Home / Self Care | Payer: No Typology Code available for payment source | Source: Ambulatory Visit | Attending: Physician Assistant | Admitting: Physician Assistant

## 2022-04-21 ENCOUNTER — Other Ambulatory Visit: Payer: Self-pay | Admitting: Physician Assistant

## 2022-04-21 ENCOUNTER — Other Ambulatory Visit: Payer: Self-pay

## 2022-04-21 DIAGNOSIS — K8020 Calculus of gallbladder without cholecystitis without obstruction: Secondary | ICD-10-CM

## 2022-04-21 DIAGNOSIS — R1011 Right upper quadrant pain: Secondary | ICD-10-CM

## 2022-04-21 DIAGNOSIS — E282 Polycystic ovarian syndrome: Secondary | ICD-10-CM

## 2022-04-21 MED ORDER — HYDROCHLOROTHIAZIDE 12.5 MG PO TABS
12.5000 mg | ORAL_TABLET | Freq: Every day | ORAL | 0 refills | Status: DC
  Filled 2022-04-21: qty 30, 30d supply, fill #0

## 2022-04-22 ENCOUNTER — Encounter: Payer: Self-pay | Admitting: Physician Assistant

## 2022-04-24 NOTE — Telephone Encounter (Signed)
Please advise 

## 2022-04-25 ENCOUNTER — Other Ambulatory Visit (HOSPITAL_COMMUNITY): Payer: Self-pay

## 2022-04-26 ENCOUNTER — Other Ambulatory Visit (HOSPITAL_COMMUNITY): Payer: Self-pay

## 2022-04-27 ENCOUNTER — Other Ambulatory Visit (HOSPITAL_COMMUNITY): Payer: Self-pay

## 2022-04-28 ENCOUNTER — Other Ambulatory Visit (HOSPITAL_COMMUNITY): Payer: Self-pay

## 2022-05-10 ENCOUNTER — Other Ambulatory Visit (HOSPITAL_COMMUNITY): Payer: Self-pay

## 2022-05-11 ENCOUNTER — Other Ambulatory Visit (HOSPITAL_COMMUNITY): Payer: Self-pay

## 2022-05-16 ENCOUNTER — Other Ambulatory Visit (HOSPITAL_COMMUNITY): Payer: Self-pay

## 2022-05-17 ENCOUNTER — Encounter: Payer: Self-pay | Admitting: Family Medicine

## 2022-05-17 ENCOUNTER — Ambulatory Visit (INDEPENDENT_AMBULATORY_CARE_PROVIDER_SITE_OTHER): Payer: PRIVATE HEALTH INSURANCE | Admitting: Family Medicine

## 2022-05-17 VITALS — BP 147/94 | Ht 63.0 in | Wt 190.0 lb

## 2022-05-17 DIAGNOSIS — S060X0D Concussion without loss of consciousness, subsequent encounter: Secondary | ICD-10-CM
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
## 2022-05-26 ENCOUNTER — Other Ambulatory Visit (HOSPITAL_COMMUNITY): Payer: Self-pay

## 2022-05-26 ENCOUNTER — Ambulatory Visit (INDEPENDENT_AMBULATORY_CARE_PROVIDER_SITE_OTHER): Payer: No Typology Code available for payment source | Admitting: Physician Assistant

## 2022-05-26 ENCOUNTER — Encounter: Payer: Self-pay | Admitting: Physician Assistant

## 2022-05-26 ENCOUNTER — Ambulatory Visit: Payer: Self-pay | Admitting: Surgery

## 2022-05-26 VITALS — BP 124/84 | HR 82 | Temp 98.4°F | Ht 63.0 in | Wt 212.8 lb

## 2022-05-26 DIAGNOSIS — Z6837 Body mass index (BMI) 37.0-37.9, adult: Secondary | ICD-10-CM | POA: Diagnosis not present

## 2022-05-26 DIAGNOSIS — K8020 Calculus of gallbladder without cholecystitis without obstruction: Secondary | ICD-10-CM

## 2022-05-26 DIAGNOSIS — I10 Essential (primary) hypertension: Secondary | ICD-10-CM

## 2022-05-26 MED ORDER — AMLODIPINE BESYLATE 10 MG PO TABS
10.0000 mg | ORAL_TABLET | Freq: Every day | ORAL | 3 refills | Status: DC
  Filled 2022-05-26: qty 90, 90d supply, fill #0
  Filled 2022-08-24: qty 90, 90d supply, fill #1
  Filled 2023-01-24: qty 90, 90d supply, fill #2

## 2022-05-26 MED ORDER — HYDROCHLOROTHIAZIDE 12.5 MG PO TABS
12.5000 mg | ORAL_TABLET | Freq: Every day | ORAL | 3 refills | Status: DC
  Filled 2022-05-26: qty 54, 54d supply, fill #0
  Filled 2022-05-26: qty 36, 36d supply, fill #0
  Filled 2022-05-26: qty 76, 76d supply, fill #0
  Filled 2022-05-26: qty 36, 36d supply, fill #0
  Filled 2022-05-26 (×2): qty 14, 14d supply, fill #0
  Filled 2022-08-24: qty 90, 90d supply, fill #1
  Filled 2023-01-24: qty 90, 90d supply, fill #2

## 2022-05-26 NOTE — Patient Instructions (Signed)
Please check in with [facility] pharmacies and see if they have starting doses of Wegovy in-stock - this might be a good option after gallbladder surgery to help with weight loss in future.
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
## 2022-06-05 NOTE — Patient Instructions (Signed)
DUE TO SPACE LIMITATIONS, ONLY TWO VISITORS  (aged 33 and older) ARE ALLOWED TO COME WITH YOU AND STAY IN THE WAITING ROOM DURING YOUR PRE OP AND PROCEDURE.   **NO VISITORS ARE ALLOWED IN THE SHORT STAY AREA OR RECOVERY ROOM!!**   You are not required to quarantine at this time prior to your surgery. However, you must do this: Hand Hygiene often Do NOT share personal items Notify your provider if you are in close contact with someone who has COVID or you develop fever 100.4 or greater, new onset of sneezing, cough, sore throat, shortness of breath or body aches.       Your procedure is scheduled on:  Monday  June 19, 2022  Report to [facility] Main Entrance.  Report to admitting at:  05:15   AM  +++++Call this number if you have any questions or problems the morning of surgery [phone]  Do not eat food :After Midnight the night prior to your surgery/procedure.  After Midnight you may have the following liquids until   04:30 AM  DAY OF SURGERY  Clear Liquid Diet Water Black Coffee (sugar ok, NO MILK/CREAM OR CREAMERS)  Tea (sugar ok, NO MILK/CREAM OR CREAMERS) regular and decaf                             Plain Jell-O (NO RED)                                           Fruit ices (not with fruit pulp, NO RED)                                     Popsicles (NO RED)                                                                  Juice: apple, WHITE grape, WHITE cranberry Sports drinks like Gatorade (NO RED)                FOLLOW BOWEL PREP AND ANY ADDITIONAL PRE OP INSTRUCTIONS YOU RECEIVED FROM YOUR SURGEON'S OFFICE!!!   Oral Hygiene is also important to reduce your risk of infection.        Remember - BRUSH YOUR TEETH THE MORNING OF SURGERY WITH YOUR REGULAR TOOTHPASTE  Take ONLY these medicines the morning of surgery with A SIP OF WATER: Amlodipine, Junel FE  BCPs,  if needed you may take Zofran ODT                   You may not have any metal on your body  including hair pins, jewelry, and body piercing  Do not wear make-up, lotions, powders, perfumes or deodorant  Do not wear nail polish including gel and S&S, artificial / acrylic nails, or any other type of covering on natural nails including finger and toenails. If you have artificial nails, gel coating, etc., that needs to be removed by a nail salon, Please have this removed prior to surgery. Not doing so may  mean that your surgery could be cancelled or delayed if the Surgeon or anesthesia staff feels like they are unable to monitor you safely.   Do not shave 48 hours prior to surgery to avoid nicks in your skin which may contribute to postoperative infections.    DO NOT BRING YOUR HOME MEDICATIONS TO THE HOSPITAL. PHARMACY WILL DISPENSE MEDICATIONS LISTED ON YOUR MEDICATION LIST TO YOU DURING YOUR ADMISSION IN THE HOSPITAL!   Patients discharged on the day of surgery will not be allowed to drive home.  Someone NEEDS to stay with you for the first 24 hours after anesthesia.  Please read over the following fact sheets you were given: IF YOU HAVE QUESTIONS ABOUT YOUR PRE-OP INSTRUCTIONS, PLEASE CALL [phone]  (KAY)   [facility] - Preparing for Surgery Before surgery, you can play an important role.  Because skin is not sterile, your skin needs to be as free of germs as possible.  You can reduce the number of germs on your skin by washing with CHG (chlorahexidine gluconate) soap before surgery.  CHG is an antiseptic cleaner which kills germs and bonds with the skin to continue killing germs even after washing. Please DO NOT use if you have an allergy to CHG or antibacterial soaps.  If your skin becomes reddened/irritated stop using the CHG and inform your nurse when you arrive at Short Stay. Do not shave (including legs and underarms) for at least 48 hours prior to the first CHG shower.  You may shave your face/neck.  Please follow these instructions carefully:  1.  Shower with CHG Soap  the night before surgery and the  morning of surgery.  2.  If you choose to wash your hair, wash your hair first as usual with your normal  shampoo.  3.  After you shampoo, rinse your hair and body thoroughly to remove the shampoo.                             4.  Use CHG as you would any other liquid soap.  You can apply chg directly to the skin and wash.  Gently with a scrungie or clean washcloth.  5.  Apply the CHG Soap to your body ONLY FROM THE NECK DOWN.   Do not use on face/ open                           Wound or open sores. Avoid contact with eyes, ears mouth and genitals (private parts).                       Wash face,  Genitals (private parts) with your normal soap.             6.  Wash thoroughly, paying special attention to the area where your  surgery  will be performed.  7.  Thoroughly rinse your body with warm water from the neck down.  8.  DO NOT shower/wash with your normal soap after using and rinsing off the CHG Soap.            9.  Pat yourself dry with a clean towel.            10.  Wear clean pajamas.            11.  Place clean sheets on your bed the night of your first shower and do  not  sleep with pets.  ON THE DAY OF SURGERY : Do not apply any lotions/deodorants the morning of surgery.  Please wear clean clothes to the hospital/surgery center.    FAILURE TO FOLLOW THESE INSTRUCTIONS MAY RESULT IN THE CANCELLATION OF YOUR SURGERY  PATIENT SIGNATURE_________________________________  NURSE SIGNATURE__________________________________  ________________________________________________________________________

## 2022-06-05 NOTE — Progress Notes (Addendum)
COVID Vaccine received:  []  No [x]  Yes Date of any COVID positive Test in last 90 days: None  PCP - Alyssa Allwardt, PA-C  Cardiologist - none  Chest x-ray - 05-05-2019  Epic EKG -  none    will get at PST appt  Stress Test - n/a ECHO - n/a Cardiac Cath -n/a   Pacemaker/ICD device     [x]  N/A Spinal Cord Stimulator:[x]  No []  Yes      Other Implants:   Bowel Prep - none per patient  History of Sleep Apnea? [x]  No []  Yes   Sleep Study Date:   CPAP used?- []  No []  Yes  (Instruct to bring their mask & Tubing)  Does the patient monitor blood sugar? []  No []  Yes  [x]  N/A  Blood Thinner Instructions: None Aspirin Instructions: Last Dose:  ERAS Protocol Ordered: []  No  [x]  Yes   no drink ordered PRE-SURGERY []  ENSURE  []  G2   Comments: Takes Metformin for PCOS, All Hgb A1C's have been normal (last  04-14-22 was 6.0)   Patient has held her Metformin for the past 2 days and says she plans to hold it until surgery.   Activity level: Patient can climb a flight of stairs without difficulty;  [x]  No CP  [x]  No SOB,   Anesthesia review: HTN,  patient states that she woke up during last 2 knee surgeries, problem with anesthesia titration, also sometimes has apnea during surgery if given too much anesthesia.   Patient denies shortness of breath, fever, cough and chest pain at PAT appointment.  Patient verbalized understanding and agreement to the Pre-Surgical Instructions that were given to them at this PAT appointment. Patient was also educated of the need to review these PAT instructions again prior to his/her surgery.I reviewed the appropriate phone numbers to call if they have any and questions or concerns.

## 2022-06-07 ENCOUNTER — Telehealth: Payer: Self-pay | Admitting: Physician Assistant

## 2022-06-07 ENCOUNTER — Encounter (HOSPITAL_COMMUNITY)
Admission: RE | Admit: 2022-06-07 | Discharge: 2022-06-07 | Disposition: A | Discharge status: Home / Self Care | Payer: No Typology Code available for payment source | Source: Ambulatory Visit | Attending: Surgery | Admitting: Surgery

## 2022-06-07 ENCOUNTER — Encounter (HOSPITAL_COMMUNITY): Payer: Self-pay

## 2022-06-07 ENCOUNTER — Other Ambulatory Visit: Payer: Self-pay

## 2022-06-07 VITALS — BP 148/92 | HR 88 | Temp 98.6°F | Ht 63.0 in | Wt 209.0 lb

## 2022-06-07 DIAGNOSIS — Z01818 Encounter for other preprocedural examination: Secondary | ICD-10-CM | POA: Insufficient documentation

## 2022-06-07 DIAGNOSIS — I10 Essential (primary) hypertension: Secondary | ICD-10-CM | POA: Diagnosis not present

## 2022-06-07 HISTORY — DX: Family history of other specified conditions: Z84.89

## 2022-06-07 HISTORY — DX: Other specified postprocedural states: Z98.890

## 2022-06-07 HISTORY — DX: Essential (primary) hypertension: I10

## 2022-06-07 HISTORY — DX: Pneumonia, unspecified organism: J18.9

## 2022-06-07 HISTORY — DX: Obsessive-compulsive disorder, unspecified: F42.9

## 2022-06-07 HISTORY — DX: Other complications of anesthesia, initial encounter: T88.59XA

## 2022-06-07 LAB — CBC
HCT: 44.7 % (ref 36.0–46.0)
Hemoglobin: 15.3 g/dL — ABNORMAL HIGH (ref 12.0–15.0)
MCH: 31.4 pg (ref 26.0–34.0)
MCHC: 34.2 g/dL (ref 30.0–36.0)
MCV: 91.6 fL (ref 80.0–100.0)
Platelets: 363 10*3/uL (ref 150–400)
RBC: 4.88 MIL/uL (ref 3.87–5.11)
RDW: 12.1 % (ref 11.5–15.5)
WBC: 7.5 10*3/uL (ref 4.0–10.5)
nRBC: 0 % (ref 0.0–0.2)

## 2022-06-07 LAB — BASIC METABOLIC PANEL
Anion gap: 11 (ref 5–15)
BUN: 16 mg/dL (ref 6–20)
CO2: 26 mmol/L (ref 22–32)
Calcium: 10.3 mg/dL (ref 8.9–10.3)
Chloride: 104 mmol/L (ref 98–111)
Creatinine, Ser: 0.73 mg/dL (ref 0.44–1.00)
GFR, Estimated: >60 mL/min (ref >60)
Glucose, Bld: 104 mg/dL — ABNORMAL HIGH (ref 70–99)
Potassium: 3.3 mmol/L — ABNORMAL LOW (ref 3.5–5.1)
Sodium: 141 mmol/L (ref 135–145)

## 2022-06-07 NOTE — Telephone Encounter (Signed)
..  Type of form received: FMLA  Additional comments:   Received by: Christie  Form should be Faxed to: [phone]  Form should be mailed to:    Is patient requesting call for pickup:   Form placed:  In provider's box   Attach charge sheet. yes  Individual made aware of 3-5 business day turn around (Y/N)?   

## 2022-06-07 NOTE — Telephone Encounter (Signed)
Called pt to see if FMLA is in reference to gallbladder removal surgery, in this case would need to take paperwork to that office since we have no details on their instructions and how long she was recommended to be out of work etc. LVM for pt to return my call with any questions

## 2022-06-08 DIAGNOSIS — Z0279 Encounter for issue of other medical certificate: Secondary | ICD-10-CM

## 2022-06-08 NOTE — Telephone Encounter (Signed)
Called pt again today to try and make sure she got previous message from 06/07/22 regarding FMLA paperwork. LVM in detail with cb number.

## 2022-06-08 NOTE — Telephone Encounter (Signed)
Patient returned my call and advised not out of work as of yet. Surgery is scheduled for 06/19/22 and first day out of work will be 06/18/22. Pt was advised by surgeon will not be able to return to work until after 07/18/22 at the earliest. Advised I would complete what we have now with what we know and it will likely need to be amended after surgery and the surgeons office should then complete it for her at that time. Pt verbalized understanding.

## 2022-06-16 ENCOUNTER — Encounter (HOSPITAL_COMMUNITY): Payer: Self-pay | Admitting: Surgery

## 2022-06-16 DIAGNOSIS — K8020 Calculus of gallbladder without cholecystitis without obstruction: Secondary | ICD-10-CM | POA: Diagnosis present

## 2022-06-16 NOTE — H&P (Signed)
REFERRING PHYSICIAN: Allwardt, Alyssa M  PROVIDER: Bryston Colocho Myra Rude, MD   Chief Complaint: New Consultation (Gallstones )  History of Present Illness:  Patient is referred by her primary care provider, Alyssa Allwardt, PA-C, for surgical evaluation and management of symptomatic cholelithiasis. 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 Patient had an initial attack of biliary colic about 6 months ago. She describes this as epigastric abdominal pain radiating to the back. She had recurrent attacks beginning in June 2023 and has had several episodes since that time. She has a dull ache in the epigastrium. She has had episodes of nausea and emesis. She denies fever but has had chills on occasion. She denies any history of jaundice or acholic stools. She has had no prior abdominal surgery. There is a history of gallbladder surgery in multiple extended family members but not in her immediate family. She underwent an ultrasound examination on April 21, 2022. This demonstrated cholelithiasis with multiple small hyperechoic shadowing stones. There was no biliary dilatation or acute inflammatory changes. Patient presents today to discuss cholecystectomy for treatment of biliary colic and cholelithiasis.  Review of Systems: A complete review of systems was obtained from the patient. I have reviewed this information and discussed as appropriate with the patient. See HPI as well for other ROS.  Review of Systems  Constitutional: Positive for chills.  HENT: Negative.  Eyes: Negative.  Respiratory: Negative.  Cardiovascular: Negative.  Gastrointestinal: Positive for abdominal pain, nausea and vomiting.  Genitourinary: Negative.  Musculoskeletal: Negative.  Skin: Negative.  Neurological: Negative.  Endo/Heme/Allergies: Negative.  Psychiatric/Behavioral: Negative.   Medical History: Past Medical History:  Diagnosis Date  Anxiety  Arthritis  GERD  (gastroesophageal reflux disease)  Hypertension   Patient Active Problem List  Diagnosis  Symptomatic cholelithiasis   Past Surgical History:  Procedure Laterality Date  Nasal Surgery 2011  Knee Surgery  2008, 2011    Allergies  Allergen Reactions  Acetaminophen Other (See Comments)  unknown  paralysis  Amoxicillin-Pot Clavulanate Nausea And Vomiting and Vomiting  Mango Flavor Rash   Current Outpatient Medications on File Prior to Visit  Medication Sig Dispense Refill  amLODIPine (NORVASC) 5 MG tablet Take 5 mg by mouth once daily  BLISOVI FE 1/20, 28, 1 mg-20 mcg (21)/75 mg (7) tablet  hydroCHLOROthiazide (HYDRODIURIL) 12.5 MG tablet Take by mouth  metFORMIN (GLUCOPHAGE) 500 MG tablet Take 1 tablet twice a day by oral route for 30 days.   No current facility-administered medications on file prior to visit.   History reviewed. No pertinent family history.   Social History   Tobacco Use  Smoking Status Never  Smokeless Tobacco Never    Social History   Socioeconomic History  Marital status: Single  Tobacco Use  Smoking status: Never  Smokeless tobacco: Never  Substance and Sexual Activity  Alcohol use: Not Currently  Drug use: Never   Objective:   Vitals:   BP: (!) 180/60  Pulse: 106  Temp: 36.2 C (97.1 F)  SpO2: 97%  Weight: 96.3 kg (212 lb 6.4 oz)  Height: 160 cm (5\' 3" )   Body mass index is 37.62 kg/m.  Physical Exam   GENERAL APPEARANCE Comfortable, no acute issues Development: normal Gross deformities: none  SKIN Rash, lesions, ulcers: none Induration, erythema: none Nodules: none palpable  EYES Conjunctiva and lids: normal Pupils: equal and reactive  EARS, NOSE, MOUTH, THROAT External ears: no lesion or deformity External nose: no lesion or deformity Hearing:  grossly normal  NECK Symmetric: yes Trachea: midline Thyroid: no palpable nodules in the thyroid bed  CHEST Respiratory effort: normal Retraction or accessory  muscle use: no Breath sounds: normal bilaterally Rales, rhonchi, wheeze: none  CARDIOVASCULAR Auscultation: regular rhythm, normal rate Murmurs: none Pulses: radial pulse 2+ palpable Lower extremity edema: none  ABDOMEN Abdomen is soft without distention. No surgical wounds. No sign of hernia. No palpable masses. Minimal tenderness to palpation in the epigastrium and right upper quadrant. No hepatosplenomegaly.  GENITOURINARY/RECTAL Not assessed  MUSCULOSKELETAL Station and gait: normal Digits and nails: no clubbing or cyanosis Muscle strength: grossly normal all extremities Range of motion: grossly normal all extremities Deformity: none  LYMPHATIC Cervical: none palpable Supraclavicular: none palpable  PSYCHIATRIC Oriented to person, place, and time: yes Mood and affect: normal for situation Judgment and insight: appropriate for situation   Assessment and Plan:   Symptomatic cholelithiasis  Patient presents on referral from her primary care provider for surgical evaluation and management of symptomatic cholelithiasis. Patient has had symptoms for approximately 6 months. She has had no significant complications. Ultrasound confirms multiple gallstones.  Today we discussed proceeding with laparoscopic cholecystectomy with intraoperative cholangiography. We discussed the surgical procedure. We discussed the size and location of the surgical incisions. We discussed the small possibility of conversion to open surgery. We would plan this is an outpatient surgical procedure under general anesthesia. We discussed her postoperative recovery and return to work and activities. We discussed dietary restrictions immediately postoperatively. The patient understands and wishes to proceed with gallbladder surgery in the near future.   Darnell Level, MD [facility] practice Office: [phone]

## 2022-06-18 ENCOUNTER — Encounter (HOSPITAL_COMMUNITY): Payer: Self-pay | Admitting: Surgery

## 2022-06-18 NOTE — Anesthesia Preprocedure Evaluation (Signed)
Anesthesia Evaluation  Patient identified by MRN, date of birth, ID band Patient awake    Reviewed: Allergy & Precautions, NPO status , Patient's Chart, lab work & pertinent test results  History of Anesthesia Complications (+) PONV, AWARENESS UNDER ANESTHESIA, Family history of anesthesia reaction and history of anesthetic complications  Airway Mallampati: II  TM Distance: >3 FB Neck ROM: Full    Dental no notable dental hx. (+) Teeth Intact, Dental Advisory Given   Pulmonary pneumonia,    Pulmonary exam normal breath sounds clear to auscultation       Cardiovascular hypertension, Pt. on medications Normal cardiovascular exam Rhythm:Regular Rate:Normal     Neuro/Psych  Headaches, PSYCHIATRIC DISORDERS Anxiety ADHD OCDHx/o TBI 2012    GI/Hepatic Neg liver ROS, GERD  ,IBS Cholelithiasis   Endo/Other  Obesity PCOS Hyperlipidemia  Renal/GU Renal diseaseHx/o nephrolithiasis  negative genitourinary   Musculoskeletal negative musculoskeletal ROS (+)   Abdominal (+) + obese,   Peds  Hematology negative hematology ROS (+)   Anesthesia Other Findings   Reproductive/Obstetrics                            Anesthesia Physical Anesthesia Plan  ASA: 2  Anesthesia Plan: General   Post-op Pain Management: Tylenol PO (pre-op)*, Ketamine IV*, Precedex and Dilaudid IV   Induction: Intravenous, Rapid sequence and Cricoid pressure planned  PONV Risk Score and Plan: 4 or greater and Treatment may vary due to age or medical condition, Scopolamine patch - Pre-op, Midazolam, Dexamethasone and Ondansetron  Airway Management Planned: Oral ETT  Additional Equipment: None  Intra-op Plan:   Post-operative Plan: Extubation in OR  Informed Consent: I have reviewed the patients History and Physical, chart, labs and discussed the procedure including the risks, benefits and alternatives for the proposed  anesthesia with the patient or authorized representative who has indicated his/her understanding and acceptance.     Dental advisory given  Plan Discussed with: CRNA and Anesthesiologist  Anesthesia Plan Comments:        Anesthesia Quick Evaluation

## 2022-06-19 ENCOUNTER — Ambulatory Visit (HOSPITAL_COMMUNITY): Payer: No Typology Code available for payment source

## 2022-06-19 ENCOUNTER — Ambulatory Visit (HOSPITAL_COMMUNITY): Payer: No Typology Code available for payment source | Admitting: Physician Assistant

## 2022-06-19 ENCOUNTER — Ambulatory Visit (HOSPITAL_BASED_OUTPATIENT_CLINIC_OR_DEPARTMENT_OTHER): Payer: No Typology Code available for payment source | Admitting: Anesthesiology

## 2022-06-19 ENCOUNTER — Encounter (HOSPITAL_COMMUNITY): Payer: Self-pay | Admitting: Surgery

## 2022-06-19 ENCOUNTER — Ambulatory Visit (HOSPITAL_COMMUNITY)
Admission: RE | Admit: 2022-06-19 | Discharge: 2022-06-19 | Disposition: A | Discharge status: Home / Self Care | Payer: No Typology Code available for payment source | Attending: Surgery | Admitting: Surgery

## 2022-06-19 ENCOUNTER — Encounter (HOSPITAL_COMMUNITY)
Admission: RE | Disposition: A | Discharge status: Home / Self Care | Payer: Self-pay | Source: Home / Self Care | Attending: Surgery

## 2022-06-19 ENCOUNTER — Other Ambulatory Visit: Payer: Self-pay

## 2022-06-19 ENCOUNTER — Other Ambulatory Visit (HOSPITAL_COMMUNITY): Payer: Self-pay

## 2022-06-19 DIAGNOSIS — K8010 Calculus of gallbladder with chronic cholecystitis without obstruction: Secondary | ICD-10-CM | POA: Diagnosis present

## 2022-06-19 DIAGNOSIS — E785 Hyperlipidemia, unspecified: Secondary | ICD-10-CM | POA: Diagnosis not present

## 2022-06-19 DIAGNOSIS — K589 Irritable bowel syndrome without diarrhea: Secondary | ICD-10-CM | POA: Insufficient documentation

## 2022-06-19 DIAGNOSIS — Z6837 Body mass index (BMI) 37.0-37.9, adult: Secondary | ICD-10-CM | POA: Insufficient documentation

## 2022-06-19 DIAGNOSIS — I10 Essential (primary) hypertension: Secondary | ICD-10-CM | POA: Diagnosis not present

## 2022-06-19 DIAGNOSIS — E669 Obesity, unspecified: Secondary | ICD-10-CM | POA: Insufficient documentation

## 2022-06-19 DIAGNOSIS — K219 Gastro-esophageal reflux disease without esophagitis: Secondary | ICD-10-CM | POA: Diagnosis not present

## 2022-06-19 DIAGNOSIS — Z01818 Encounter for other preprocedural examination: Secondary | ICD-10-CM

## 2022-06-19 DIAGNOSIS — E282 Polycystic ovarian syndrome: Secondary | ICD-10-CM | POA: Diagnosis not present

## 2022-06-19 DIAGNOSIS — K8064 Calculus of gallbladder and bile duct with chronic cholecystitis without obstruction: Secondary | ICD-10-CM | POA: Diagnosis not present

## 2022-06-19 DIAGNOSIS — K8020 Calculus of gallbladder without cholecystitis without obstruction: Secondary | ICD-10-CM

## 2022-06-19 HISTORY — PX: CHOLECYSTECTOMY: SHX55

## 2022-06-19 LAB — POCT PREGNANCY, URINE: Preg Test, Ur: NEGATIVE

## 2022-06-19 SURGERY — LAPAROSCOPIC CHOLECYSTECTOMY WITH INTRAOPERATIVE CHOLANGIOGRAM
Anesthesia: General

## 2022-06-19 MED ORDER — HYDROMORPHONE HCL 1 MG/ML IJ SOLN
INTRAMUSCULAR | Status: DC | PRN
  Administered 2022-06-19: 1 mg via INTRAVENOUS

## 2022-06-19 MED ORDER — ROCURONIUM BROMIDE 10 MG/ML (PF) SYRINGE
PREFILLED_SYRINGE | INTRAVENOUS | Status: DC | PRN
  Administered 2022-06-19: 80 mg via INTRAVENOUS

## 2022-06-19 MED ORDER — ONDANSETRON HCL 4 MG/2ML IJ SOLN
INTRAMUSCULAR | Status: AC
Start: 2022-06-19 — End: ?
  Filled 2022-06-19: qty 2

## 2022-06-19 MED ORDER — MIDAZOLAM HCL 2 MG/2ML IJ SOLN
INTRAMUSCULAR | Status: AC
  Filled 2022-06-19: qty 2

## 2022-06-19 MED ORDER — ROCURONIUM BROMIDE 10 MG/ML (PF) SYRINGE
PREFILLED_SYRINGE | INTRAVENOUS | Status: AC
  Filled 2022-06-19: qty 20

## 2022-06-19 MED ORDER — TRAMADOL HCL 50 MG PO TABS
50.0000 mg | ORAL_TABLET | Freq: Four times a day (QID) | ORAL | 0 refills | Status: DC | PRN
  Filled 2022-06-19: qty 15, 2d supply, fill #0

## 2022-06-19 MED ORDER — ACETAMINOPHEN 500 MG PO TABS: 1000.0000 mg | ORAL_TABLET | Freq: Once | ORAL | Status: DC

## 2022-06-19 MED ORDER — BUPIVACAINE-EPINEPHRINE 0.5% -1:200000 IJ SOLN
INTRAMUSCULAR | Status: DC | PRN
  Administered 2022-06-19: 30 mL

## 2022-06-19 MED ORDER — LIDOCAINE 2% (20 MG/ML) 5 ML SYRINGE
INTRAMUSCULAR | Status: DC | PRN
  Administered 2022-06-19: 60 mg via INTRAVENOUS

## 2022-06-19 MED ORDER — SCOPOLAMINE 1 MG/3DAYS TD PT72
1.0000 | MEDICATED_PATCH | TRANSDERMAL | Status: DC
  Administered 2022-06-19: 1.5 mg via TRANSDERMAL

## 2022-06-19 MED ORDER — CHLORHEXIDINE GLUCONATE CLOTH 2 % EX PADS: 6.0000 | MEDICATED_PAD | Freq: Once | CUTANEOUS | Status: DC

## 2022-06-19 MED ORDER — PROPOFOL 10 MG/ML IV BOLUS
INTRAVENOUS | Status: AC
  Filled 2022-06-19: qty 20

## 2022-06-19 MED ORDER — OXYCODONE HCL 5 MG/5ML PO SOLN: 5.0000 mg | Freq: Once | ORAL | Status: DC | PRN

## 2022-06-19 MED ORDER — HYDROMORPHONE HCL 1 MG/ML IJ SOLN
0.2500 mg | INTRAMUSCULAR | Status: DC | PRN
  Administered 2022-06-19 (×3): 0.5 mg via INTRAVENOUS

## 2022-06-19 MED ORDER — HYDROMORPHONE HCL 2 MG/ML IJ SOLN
INTRAMUSCULAR | Status: AC
  Filled 2022-06-19: qty 1

## 2022-06-19 MED ORDER — DEXAMETHASONE SODIUM PHOSPHATE 10 MG/ML IJ SOLN
INTRAMUSCULAR | Status: AC
Start: 2022-06-19 — End: ?
  Filled 2022-06-19: qty 1

## 2022-06-19 MED ORDER — ONDANSETRON HCL 4 MG/2ML IJ SOLN
4.0000 mg | Freq: Once | INTRAMUSCULAR | Status: AC | PRN
  Administered 2022-06-19: 4 mg via INTRAVENOUS

## 2022-06-19 MED ORDER — PHENYLEPHRINE 80 MCG/ML (10ML) SYRINGE FOR IV PUSH (FOR BLOOD PRESSURE SUPPORT)
PREFILLED_SYRINGE | INTRAVENOUS | Status: AC
  Filled 2022-06-19: qty 10

## 2022-06-19 MED ORDER — FENTANYL CITRATE (PF) 100 MCG/2ML IJ SOLN
INTRAMUSCULAR | Status: DC | PRN
  Administered 2022-06-19: 100 ug via INTRAVENOUS

## 2022-06-19 MED ORDER — HYDROMORPHONE HCL 1 MG/ML IJ SOLN
INTRAMUSCULAR | Status: AC
  Filled 2022-06-19: qty 1

## 2022-06-19 MED ORDER — LACTATED RINGERS IV SOLN: INTRAVENOUS | Status: DC | PRN

## 2022-06-19 MED ORDER — OXYCODONE HCL 5 MG PO TABS: 5.0000 mg | ORAL_TABLET | Freq: Once | ORAL | Status: DC | PRN

## 2022-06-19 MED ORDER — FENTANYL CITRATE (PF) 100 MCG/2ML IJ SOLN
INTRAMUSCULAR | Status: AC
  Filled 2022-06-19: qty 2

## 2022-06-19 MED ORDER — ORAL CARE MOUTH RINSE
15.0000 mL | Freq: Once | OROMUCOSAL | Status: AC
Start: 2022-06-19 — End: 2022-06-19

## 2022-06-19 MED ORDER — LACTATED RINGERS IV SOLN: INTRAVENOUS | Status: DC

## 2022-06-19 MED ORDER — BUPIVACAINE-EPINEPHRINE (PF) 0.5% -1:200000 IJ SOLN
INTRAMUSCULAR | Status: AC
Start: 2022-06-19 — End: ?
  Filled 2022-06-19: qty 30

## 2022-06-19 MED ORDER — SODIUM CHLORIDE (PF) 0.9 % IJ SOLN
INTRAMUSCULAR | Status: DC | PRN
  Administered 2022-06-19: 14 mL

## 2022-06-19 MED ORDER — LACTATED RINGERS IR SOLN
Status: DC | PRN
  Administered 2022-06-19: 1000 mL

## 2022-06-19 MED ORDER — MIDAZOLAM HCL 5 MG/5ML IJ SOLN
INTRAMUSCULAR | Status: DC | PRN
  Administered 2022-06-19: 2 mg via INTRAVENOUS

## 2022-06-19 MED ORDER — SCOPOLAMINE 1 MG/3DAYS TD PT72
MEDICATED_PATCH | TRANSDERMAL | Status: AC
  Filled 2022-06-19: qty 1

## 2022-06-19 MED ORDER — LIDOCAINE HCL (PF) 2 % IJ SOLN
INTRAMUSCULAR | Status: AC
Start: 2022-06-19 — End: ?
  Filled 2022-06-19: qty 5

## 2022-06-19 MED ORDER — SUGAMMADEX SODIUM 200 MG/2ML IV SOLN
INTRAVENOUS | Status: DC | PRN
  Administered 2022-06-19: 200 mg via INTRAVENOUS

## 2022-06-19 MED ORDER — AMISULPRIDE (ANTIEMETIC) 5 MG/2ML IV SOLN
INTRAVENOUS | Status: AC
  Filled 2022-06-19: qty 2

## 2022-06-19 MED ORDER — ONDANSETRON HCL 4 MG/2ML IJ SOLN
INTRAMUSCULAR | Status: AC
  Filled 2022-06-19: qty 2

## 2022-06-19 MED ORDER — PROPOFOL 10 MG/ML IV BOLUS
INTRAVENOUS | Status: DC | PRN
  Administered 2022-06-19: 200 mg via INTRAVENOUS

## 2022-06-19 MED ORDER — CHLORHEXIDINE GLUCONATE 0.12 % MT SOLN
15.0000 mL | Freq: Once | OROMUCOSAL | Status: AC
  Administered 2022-06-19: 15 mL via OROMUCOSAL

## 2022-06-19 MED ORDER — DEXAMETHASONE SODIUM PHOSPHATE 10 MG/ML IJ SOLN
INTRAMUSCULAR | Status: DC | PRN
  Administered 2022-06-19: 10 mg via INTRAVENOUS

## 2022-06-19 MED ORDER — 0.9 % SODIUM CHLORIDE (POUR BTL) OPTIME
TOPICAL | Status: DC | PRN
  Administered 2022-06-19: 1000 mL

## 2022-06-19 MED ORDER — EPHEDRINE 5 MG/ML INJ
INTRAVENOUS | Status: AC
  Filled 2022-06-19: qty 5

## 2022-06-19 MED ORDER — CIPROFLOXACIN IN D5W 400 MG/200ML IV SOLN
400.0000 mg | INTRAVENOUS | Status: AC
  Administered 2022-06-19: 400 mg via INTRAVENOUS
  Filled 2022-06-19: qty 200

## 2022-06-19 MED ORDER — SCOPOLAMINE 1 MG/3DAYS TD PT72: 1.0000 | MEDICATED_PATCH | TRANSDERMAL | Status: DC

## 2022-06-19 MED ORDER — AMISULPRIDE (ANTIEMETIC) 5 MG/2ML IV SOLN
10.0000 mg | Freq: Once | INTRAVENOUS | Status: AC
Start: 2022-06-19 — End: 2022-06-19
  Administered 2022-06-19: 10 mg via INTRAVENOUS

## 2022-06-19 MED ORDER — ONDANSETRON HCL 4 MG/2ML IJ SOLN
INTRAMUSCULAR | Status: DC | PRN
  Administered 2022-06-19: 4 mg via INTRAVENOUS

## 2022-06-19 SURGICAL SUPPLY — 38 items
APPLIER CLIP ROT 10 11.4 M/L (STAPLE) ×1
BAG COUNTER SPONGE SURGICOUNT (BAG) IMPLANT
CABLE HIGH FREQUENCY MONO STRZ (ELECTRODE) ×1 IMPLANT
CHLORAPREP W/TINT 26 (MISCELLANEOUS) ×2 IMPLANT
CLIP APPLIE ROT 10 11.4 M/L (STAPLE) ×1 IMPLANT
COVER MAYO STAND XLG (MISCELLANEOUS) ×1 IMPLANT
COVER SURGICAL LIGHT HANDLE (MISCELLANEOUS) ×1 IMPLANT
DERMABOND ADVANCED .7 DNX12 (GAUZE/BANDAGES/DRESSINGS) IMPLANT
DRAPE C-ARM 42X120 X-RAY (DRAPES) ×1 IMPLANT
ELECT REM PT RETURN 15FT ADLT (MISCELLANEOUS) ×1 IMPLANT
GAUZE SPONGE 2X2 8PLY STRL LF (GAUZE/BANDAGES/DRESSINGS) ×1 IMPLANT
GLOVE SURG ORTHO 8.0 STRL STRW (GLOVE) ×1 IMPLANT
GLOVE SURG SYN 7.5  E (GLOVE) ×2
GLOVE SURG SYN 7.5 E (GLOVE) ×2 IMPLANT
GLOVE SURG SYN 7.5 PF PI (GLOVE) ×2 IMPLANT
GOWN STRL REUS W/ TWL XL LVL3 (GOWN DISPOSABLE) ×2 IMPLANT
GOWN STRL REUS W/TWL XL LVL3 (GOWN DISPOSABLE) ×2
HEMOSTAT SURGICEL 4X8 (HEMOSTASIS) IMPLANT
IRRIG SUCT STRYKERFLOW 2 WTIP (MISCELLANEOUS) ×1
IRRIGATION SUCT STRKRFLW 2 WTP (MISCELLANEOUS) ×1 IMPLANT
KIT BASIN OR (CUSTOM PROCEDURE TRAY) ×1 IMPLANT
KIT TURNOVER KIT A (KITS) IMPLANT
PENCIL SMOKE EVACUATOR (MISCELLANEOUS) IMPLANT
SCISSORS LAP 5X35 DISP (ENDOMECHANICALS) ×1 IMPLANT
SET CHOLANGIOGRAPH MIX (MISCELLANEOUS) ×1 IMPLANT
SET TUBE SMOKE EVAC HIGH FLOW (TUBING) IMPLANT
SLEEVE Z-THREAD 5X100MM (TROCAR) ×1 IMPLANT
SPIKE FLUID TRANSFER (MISCELLANEOUS) ×1 IMPLANT
STRIP CLOSURE SKIN 1/2X4 (GAUZE/BANDAGES/DRESSINGS) IMPLANT
SUT MNCRL AB 4-0 PS2 18 (SUTURE) ×1 IMPLANT
SYS BAG RETRIEVAL 10MM (BASKET) ×1
SYSTEM BAG RETRIEVAL 10MM (BASKET) ×1 IMPLANT
TOWEL OR 17X26 10 PK STRL BLUE (TOWEL DISPOSABLE) ×1 IMPLANT
TOWEL OR NON WOVEN STRL DISP B (DISPOSABLE) ×1 IMPLANT
TRAY LAPAROSCOPIC (CUSTOM PROCEDURE TRAY) ×1 IMPLANT
TROCAR 11X100 Z THREAD (TROCAR) ×1 IMPLANT
TROCAR BALLN 12MMX100 BLUNT (TROCAR) ×1 IMPLANT
TROCAR Z-THREAD OPTICAL 5X100M (TROCAR) ×1 IMPLANT

## 2022-06-19 NOTE — Op Note (Signed)
Procedure Note  Pre-operative Diagnosis:  Chronic cholecystitis, cholelithiasis, biliary colic  Post-operative Diagnosis:  same  Surgeon:  Darnell Level, MD  Assistant:  none   Procedure:  Laparoscopic cholecystectomy with intra-operative cholangiography  Anesthesia:  General  Estimated Blood Loss:  minimal  Drains: none         Specimen: gallbladder to pathology  Indications:  Patient is referred by her primary care provider, Alyssa Allwardt, PA-C, for surgical evaluation and management of symptomatic cholelithiasis. 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 Patient had an initial attack of biliary colic about 6 months ago. She describes this as epigastric abdominal pain radiating to the back. She had recurrent attacks beginning in June 2023 and has had several episodes since that time. She has a dull ache in the epigastrium. She has had episodes of nausea and emesis. She denies fever but has had chills on occasion. She denies any history of jaundice or acholic stools. She has had no prior abdominal surgery. There is a history of gallbladder surgery in multiple extended family members but not in her immediate family. She underwent an ultrasound examination on April 21, 2022. This demonstrated cholelithiasis with multiple small hyperechoic shadowing stones. There was no biliary dilatation or acute inflammatory changes. Patient presents today to discuss cholecystectomy for treatment of biliary colic and cholelithiasis.  Procedure description: The patient was seen in the pre-op holding area. The risks, benefits, complications, treatment options, and expected outcomes were previously discussed with the patient. The patient agreed with the proposed plan and has signed the informed consent form.  The patient was transported to operating room #4 at the [facility]. The patient was placed in the supine position on the operating room table. Following  induction of general anesthesia, the abdomen was prepped and draped in the usual aseptic fashion.  An incision was made in the skin near the umbilicus. The midline fascia was incised and the peritoneal cavity was entered and a Hasson cannula was introduced under direct vision. The cannula was secured with a 0-Vicryl pursestring suture. Pneumoperitoneum was established with carbon dioxide. Additional cannulae were introduced under direct vision along the right costal margin in the midline, mid-clavicular line, and anterior axillary line.   The gallbladder was identified and the fundus grasped and retracted cephalad. Adhesions were taken down bluntly and the electrocautery was utilized as needed, taking care not to involve any adjacent structures. The infundibulum was grasped and retracted laterally, exposing the peritoneum overlying the triangle of Calot. The peritoneum was incised and structures exposed with blunt dissection. The cystic duct was clearly identified, bluntly dissected circumferentially, and clipped at the neck of the gallbladder.  An incision was made in the cystic duct and the cholangiogram catheter introduced. The catheter was secured using an ligaclip.  Real-time cholangiography was performed using C-arm fluoroscopy.  There was rapid filling of a normal caliber common bile duct.  There was reflux of contrast into the left and right hepatic ductal systems.  There was free flow distally into the duodenum without filling defect or obstruction.  The catheter was removed from the peritoneal cavity.  The cystic duct was then ligated with ligaclips and divided. The cystic artery was identified, dissected circumferentially, ligated with ligaclips, and divided.  The gallbladder was dissected away from the gallbladder bed using the electrocautery for hemostasis. The gallbladder was completely removed from the liver and placed into an endocatch bag. The gallbladder was removed in the endocatch bag  through  the umbilical port site and submitted to pathology for review.  The right upper quadrant was irrigated and the gallbladder bed was inspected. Hemostasis was achieved with the electrocautery.  Cannulae were removed under direct vision and good hemostasis was noted. Pneumoperitoneum was released and the majority of the carbon dioxide evacuated. The umbilical wound was irrigated and the fascia was then closed with the pursestring suture.  Local anesthetic was infiltrated at all port sites. Skin incisions were closed with 4-0 Monocril subcuticular sutures and Dermabond was applied.  Instrument, sponge, and needle counts were correct at the conclusion of the case.  The patient was awakened from anesthesia and brought to the recovery room in stable condition.  The patient tolerated the procedure well.   Darnell Level, MD [facility] Surgery Office: [phone]

## 2022-06-19 NOTE — Anesthesia Postprocedure Evaluation (Signed)
Anesthesia Post Note  Patient: Oretta Berkland  Procedure(s) Performed: LAPAROSCOPIC CHOLECYSTECTOMY WITH INTRAOPERATIVE CHOLANGIOGRAM     Patient location during evaluation: PACU Anesthesia Type: General Level of consciousness: awake and alert and oriented Pain management: pain level controlled Vital Signs Assessment: post-procedure vital signs reviewed and stable Respiratory status: spontaneous breathing, nonlabored ventilation and respiratory function stable Cardiovascular status: blood pressure returned to baseline and stable Postop Assessment: no apparent nausea or vomiting Anesthetic complications: no   No notable events documented.  Last Vitals:  Vitals:   06/19/22 0930 06/19/22 0945  BP: 118/74 104/67  Pulse: 97 88  Resp: 18 11  Temp:    SpO2: 93% 91%    Last Pain:  Vitals:   06/19/22 0945  TempSrc:   PainSc: Asleep                 Orazio Weller A.

## 2022-06-19 NOTE — Interval H&P Note (Signed)
History and Physical Interval Note:  06/19/2022 7:00 AM  Jennifer Solis  has presented today for surgery, with the diagnosis of SYMPTOMATIC CHOLELITHIASIS.  The various methods of treatment have been discussed with the patient and family. After consideration of risks, benefits and other options for treatment, the patient has consented to    Procedure(s): LAPAROSCOPIC CHOLECYSTECTOMY WITH INTRAOPERATIVE CHOLANGIOGRAM (N/A) as a surgical intervention.    The patient's history has been reviewed, patient examined, no change in status, stable for surgery.  I have reviewed the patient's chart and labs.  Questions were answered to the patient's satisfaction.    Darnell Level, MD [facility] practice Office: [phone]   Darnell Level

## 2022-06-19 NOTE — Anesthesia Procedure Notes (Signed)
Procedure Name: Intubation Date/Time: 06/19/2022 7:32 AM  Performed by: Gerald Leitz, CRNAPre-anesthesia Checklist: Patient identified, Patient being monitored, Timeout performed, Emergency Drugs available and Suction available Patient Re-evaluated:Patient Re-evaluated prior to induction Oxygen Delivery Method: Circle system utilized Preoxygenation: Pre-oxygenation with 100% oxygen Induction Type: IV induction Ventilation: Mask ventilation without difficulty Laryngoscope Size: Mac and 3 Grade View: Grade I Tube type: Oral Tube size: 7.0 mm Number of attempts: 1 Airway Equipment and Method: Stylet Placement Confirmation: ETT inserted through vocal cords under direct vision, positive ETCO2 and breath sounds checked- equal and bilateral Secured at: 21 cm Tube secured with: Tape Dental Injury: Teeth and Oropharynx as per pre-operative assessment

## 2022-06-19 NOTE — Discharge Instructions (Addendum)
[facility]  LAPAROSCOPIC SURGERY:  POST-OP INSTRUCTIONS  Always review your discharge instruction sheet given to you by the facility where your surgery was performed.  A prescription for pain medication may be given to you upon discharge.  Take your pain medication as prescribed.  If narcotic pain medicine is not needed, then you may take acetaminophen (Tylenol) or ibuprofen (Advil) as needed.  Take your usually prescribed medications unless otherwise directed.  If you need a refill on your pain medication, please contact your pharmacy.  They will contact our office to request authorization. Prescriptions will not be filled after 5 P.M. or on weekends.  You should follow a light diet the first few days after arrival home, such as soup and crackers or toast.  Be sure to include plenty of fluids daily.  Most patients will experience some swelling and bruising in the area of the incisions.  Ice packs will help.  Swelling and bruising can take several days to resolve.   It is common to experience some constipation after surgery.  Increasing fluid intake and taking a stool softener (such as Colace) will usually help or prevent this problem from occurring.  A mild laxative (Milk of Magnesia or Miralax) should be taken according to package instructions if there has been no bowel movement after 48 hours.  You will likely have Dermabond (topical glue) over your incisions.  This seals the incisions and allows you to bathe and shower at any time after your surgery.  Glue should remain in place for up to 10 days.  It may be removed after 10 days by pealing off the Dermabond material or using Vaseline or naval jelly to remove.  If you have steri-strips over your incisions, you may remove the gauze bandage on the second day after surgery, and you may shower at that time.  Leave your steri-strips (small skin tapes) in place directly over the incision.  These strips should remain on the  skin for 5-7 days and then be removed.  You may get them wet in the shower and pat them dry.  Any sutures or staples will be removed at the office during your follow-up visit.  ACTIVITIES:  You may resume regular (light) daily activities beginning the next day - such as daily self-care, walking, climbing stairs - gradually increasing activities as tolerated.  You may have sexual intercourse when it is comfortable.  Refrain from any heavy lifting or straining until approved by your doctor.  You may drive when you are no longer taking prescription pain medication, when you can comfortably wear a seatbelt, and when you can safely maneuver your car and apply brakes.  You should see your doctor in the office for a follow-up appointment approximately 2-3 weeks after your surgery.  Make sure that you call for this appointment within a day or two after you arrive home to insure a convenient appointment time.  WHEN TO CALL YOUR DOCTOR: Fever over 101.0 Inability to urinate Continued bleeding from incision Increased pain, redness, or drainage from the incision Increasing abdominal pain  The clinic staff is available to answer your questions during regular business hours.  Please don't hesitate to call and ask to speak to one of the nurses for clinical concerns.  If you have a medical emergency, go to the nearest emergency room or call 911.  A surgeon from [facility] is always on call for the hospital.  Todd Gerkin, MD [facility] Office: [phone] Toll Free:    [phone] FAX [phone]  Website: [email or web address] 

## 2022-06-19 NOTE — Transfer of Care (Signed)
Immediate Anesthesia Transfer of Care Note  Patient: Jennifer Solis  Procedure(s) Performed: Procedure(s): LAPAROSCOPIC CHOLECYSTECTOMY WITH INTRAOPERATIVE CHOLANGIOGRAM (N/A)  Patient Location: PACU  Anesthesia Type:General  Level of Consciousness: Alert, Awake, Oriented  Airway & Oxygen Therapy: Patient Spontanous Breathing  Post-op Assessment: Report given to RN  Post vital signs: Reviewed and stable  Last Vitals:  Vitals:   06/19/22 0537 06/19/22 0844  BP: (!) 138/98 127/83  Pulse: 100 95  Resp: 16 16  Temp: 36.7 C 36.4 C  SpO2: 99% 100%    Complications: No apparent anesthesia complications

## 2022-06-20 ENCOUNTER — Encounter (HOSPITAL_COMMUNITY): Payer: Self-pay | Admitting: Surgery

## 2022-06-20 LAB — SURGICAL PATHOLOGY

## 2022-06-27 ENCOUNTER — Other Ambulatory Visit (HOSPITAL_COMMUNITY): Payer: Self-pay

## 2022-07-03 ENCOUNTER — Encounter: Payer: Self-pay | Admitting: *Deleted

## 2022-07-16 ENCOUNTER — Encounter: Payer: Self-pay | Admitting: Physician Assistant

## 2022-07-17 NOTE — Telephone Encounter (Signed)
Please advise if you are ok with recommending Dr Theodis Shove for patient. Also, given the situation with [facility] shortage how would you like to proceed for patient? Please advise

## 2022-07-20 NOTE — Telephone Encounter (Signed)
Please advise or would you prefer pt to schedule an office visit to discuss

## 2022-07-21 NOTE — Telephone Encounter (Signed)
Patient requesting referral to [facility] with Briscoe Deutscher, ok to place referral to [facility]?

## 2022-07-24 ENCOUNTER — Other Ambulatory Visit: Payer: Self-pay

## 2022-08-03 ENCOUNTER — Other Ambulatory Visit (HOSPITAL_COMMUNITY): Payer: Self-pay

## 2022-08-03 MED ORDER — MOUNJARO 2.5 MG/0.5ML ~~LOC~~ SOAJ
2.5000 mg | SUBCUTANEOUS | 0 refills | Status: DC
  Filled 2022-08-03: qty 2, 28d supply, fill #0

## 2022-08-04 ENCOUNTER — Other Ambulatory Visit (HOSPITAL_COMMUNITY): Payer: Self-pay

## 2022-08-07 ENCOUNTER — Ambulatory Visit (INDEPENDENT_AMBULATORY_CARE_PROVIDER_SITE_OTHER): Payer: No Typology Code available for payment source | Admitting: Behavioral Health

## 2022-08-07 DIAGNOSIS — F4323 Adjustment disorder with mixed anxiety and depressed mood: Secondary | ICD-10-CM

## 2022-08-07 DIAGNOSIS — F4310 Post-traumatic stress disorder, unspecified: Secondary | ICD-10-CM

## 2022-08-07 DIAGNOSIS — Z8659 Personal history of other mental and behavioral disorders: Secondary | ICD-10-CM | POA: Diagnosis not present

## 2022-08-07 NOTE — Progress Notes (Signed)
                Lidya Mccalister L Atharva Mirsky, LMFT 
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
## 2022-08-22 ENCOUNTER — Other Ambulatory Visit (HOSPITAL_COMMUNITY): Payer: Self-pay

## 2022-08-22 MED ORDER — MOUNJARO 2.5 MG/0.5ML ~~LOC~~ SOAJ
2.5000 mg | SUBCUTANEOUS | 0 refills | Status: DC
  Filled 2022-08-22 – 2022-09-02 (×3): qty 2, 28d supply, fill #0

## 2022-08-24 ENCOUNTER — Ambulatory Visit (INDEPENDENT_AMBULATORY_CARE_PROVIDER_SITE_OTHER): Payer: No Typology Code available for payment source | Admitting: Behavioral Health

## 2022-08-24 ENCOUNTER — Other Ambulatory Visit (HOSPITAL_COMMUNITY): Payer: Self-pay

## 2022-08-24 DIAGNOSIS — F4323 Adjustment disorder with mixed anxiety and depressed mood: Secondary | ICD-10-CM | POA: Diagnosis not present

## 2022-08-24 DIAGNOSIS — F4310 Post-traumatic stress disorder, unspecified: Secondary | ICD-10-CM

## 2022-08-24 MED ORDER — METFORMIN HCL 500 MG PO TABS
500.0000 mg | ORAL_TABLET | Freq: Two times a day (BID) | ORAL | 0 refills | Status: AC
Start: 2022-08-24 — End: ?
  Filled 2022-08-24: qty 60, 30d supply, fill #0

## 2022-08-24 NOTE — Progress Notes (Signed)
                Zia Kanner L Amorah Sebring, LMFT 
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
## 2022-08-28 ENCOUNTER — Other Ambulatory Visit (HOSPITAL_COMMUNITY): Payer: Self-pay

## 2022-08-28 MED ORDER — CEPHALEXIN 500 MG PO CAPS
500.0000 mg | ORAL_CAPSULE | Freq: Four times a day (QID) | ORAL | 0 refills | Status: DC
  Filled 2022-08-28: qty 40, 10d supply, fill #0

## 2022-08-28 MED ORDER — CEPHALEXIN 500 MG PO CAPS
500.0000 mg | ORAL_CAPSULE | Freq: Four times a day (QID) | ORAL | 0 refills | Status: DC
Start: 2022-08-28 — End: 2022-12-08
  Filled 2022-08-28: qty 40, 10d supply, fill #0

## 2022-08-28 MED ORDER — CELECOXIB 200 MG PO CAPS
200.0000 mg | ORAL_CAPSULE | Freq: Two times a day (BID) | ORAL | 0 refills | Status: DC
  Filled 2022-08-28: qty 14, 7d supply, fill #0

## 2022-09-04 ENCOUNTER — Other Ambulatory Visit (HOSPITAL_COMMUNITY): Payer: Self-pay

## 2022-09-14 ENCOUNTER — Ambulatory Visit (INDEPENDENT_AMBULATORY_CARE_PROVIDER_SITE_OTHER): Payer: No Typology Code available for payment source | Admitting: Behavioral Health

## 2022-09-14 ENCOUNTER — Other Ambulatory Visit (HOSPITAL_COMMUNITY): Payer: Self-pay

## 2022-09-14 DIAGNOSIS — Z8659 Personal history of other mental and behavioral disorders: Secondary | ICD-10-CM

## 2022-09-14 DIAGNOSIS — F4323 Adjustment disorder with mixed anxiety and depressed mood: Secondary | ICD-10-CM

## 2022-09-14 DIAGNOSIS — F4310 Post-traumatic stress disorder, unspecified: Secondary | ICD-10-CM | POA: Diagnosis not present

## 2022-09-14 MED ORDER — MOUNJARO 5 MG/0.5ML ~~LOC~~ SOAJ
5.0000 mg | SUBCUTANEOUS | 0 refills | Status: DC
Start: 2022-09-14 — End: 2022-10-24
  Filled 2022-09-14 – 2022-09-27 (×2): qty 2, 28d supply, fill #0

## 2022-09-14 NOTE — Progress Notes (Signed)
[place] [facility] Counselor/Therapist Progress Note  Patient ID: Jennifer Solis, MRN: [id number],    Date: 09/14/2022  Time Spent: 55 min  Caregility video; Pt is home in private & Provider in Home Office remote  Treatment Type: Individual Therapy  Reported Symptoms: elevated anx/dep due to surgical repair of facial injuries & their repair  Mental Status Exam: Appearance:  Casual     Behavior: Appropriate and Sharing  Motor: Normal  Speech/Language:  Clear and Coherent  Affect: Appropriate  Mood: normal  Thought process: normal  Thought content:   WNL  Sensory/Perceptual disturbances:   WNL  Orientation: oriented to person, place, and time/date  Attention: Good  Concentration: Good  Memory: WNL  Fund of knowledge:  Good  Insight:   Good  Judgment:  Good  Impulse Control: Good   Risk Assessment: Danger to Self:  No Self-injurious Behavior: No Danger to Others: No Duty to Warn:no Physical Aggression / Violence:No  Access to Firearms a concern: No  Gang Involvement:No   Subjective: Pt is nervous to RTW & her gradual integration into the work environment.    Interventions: Solution-Oriented/Positive Psychology  Diagnosis:Adjustment disorder with mixed anxiety and depressed mood  PTSD (post-traumatic stress disorder)  History of posttraumatic stress disorder (PTSD)  Plan: Jennifer Solis is RTW @ FT status of 36 hrs/wk soon & she is hesitant about this status. She will try this for one month & see how it works.  Target Date: 10/23/2022  Progress: 0  Frequency: Twice monthly   Modality: Claretta Fraise, LMFT

## 2022-09-14 NOTE — Progress Notes (Signed)
                Kearney Evitt L Debar Plate, LMFT 

## 2022-09-21 ENCOUNTER — Encounter: Payer: Self-pay | Admitting: *Deleted

## 2022-09-25 ENCOUNTER — Other Ambulatory Visit (HOSPITAL_COMMUNITY): Payer: Self-pay

## 2022-09-25 MED ORDER — FLUCONAZOLE 150 MG PO TABS
150.0000 mg | ORAL_TABLET | ORAL | 0 refills | Status: DC
Start: 2022-09-25 — End: 2022-12-08
  Filled 2022-09-25: qty 2, 5d supply, fill #0

## 2022-09-27 ENCOUNTER — Ambulatory Visit: Payer: No Typology Code available for payment source | Admitting: Behavioral Health

## 2022-09-27 ENCOUNTER — Other Ambulatory Visit: Payer: Self-pay

## 2022-09-27 ENCOUNTER — Other Ambulatory Visit (HOSPITAL_COMMUNITY): Payer: Self-pay

## 2022-10-22 ENCOUNTER — Other Ambulatory Visit (HOSPITAL_COMMUNITY): Payer: Self-pay
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
## 2022-10-24 ENCOUNTER — Other Ambulatory Visit (HOSPITAL_COMMUNITY): Payer: Self-pay

## 2022-10-24 MED ORDER — MOUNJARO 5 MG/0.5ML ~~LOC~~ SOAJ
5.0000 mg | SUBCUTANEOUS | 0 refills | Status: DC
  Filled 2022-10-24: qty 2, 28d supply, fill #0

## 2022-10-25 DIAGNOSIS — Z9189 Other specified personal risk factors, not elsewhere classified: Secondary | ICD-10-CM | POA: Diagnosis not present

## 2022-10-25 DIAGNOSIS — E282 Polycystic ovarian syndrome: Secondary | ICD-10-CM | POA: Diagnosis not present

## 2022-10-25 DIAGNOSIS — E669 Obesity, unspecified: Secondary | ICD-10-CM | POA: Diagnosis not present

## 2022-10-25 DIAGNOSIS — Z6834 Body mass index (BMI) 34.0-34.9, adult: Secondary | ICD-10-CM | POA: Diagnosis not present

## 2022-10-25 DIAGNOSIS — R7303 Prediabetes: Secondary | ICD-10-CM | POA: Diagnosis not present

## 2022-10-26 ENCOUNTER — Other Ambulatory Visit (HOSPITAL_COMMUNITY): Payer: Self-pay

## 2022-10-26 MED ORDER — WEGOVY 1 MG/0.5ML ~~LOC~~ SOAJ
1.0000 mg | SUBCUTANEOUS | 0 refills | Status: DC
  Filled 2022-10-26 – 2022-11-01 (×3): qty 2, 28d supply, fill #0

## 2022-11-01 ENCOUNTER — Other Ambulatory Visit (HOSPITAL_COMMUNITY): Payer: Self-pay

## 2022-11-15 ENCOUNTER — Ambulatory Visit: Payer: 59 | Admitting: Behavioral Health

## 2022-11-15 NOTE — Progress Notes (Unsigned)
                Johnston Maddocks L Lalanya Rufener, LMFT 

## 2022-11-22 DIAGNOSIS — R7303 Prediabetes: Secondary | ICD-10-CM | POA: Diagnosis not present

## 2022-11-22 DIAGNOSIS — E282 Polycystic ovarian syndrome: Secondary | ICD-10-CM | POA: Diagnosis not present

## 2022-11-22 DIAGNOSIS — E669 Obesity, unspecified: Secondary | ICD-10-CM | POA: Diagnosis not present

## 2022-11-22 DIAGNOSIS — Z6834 Body mass index (BMI) 34.0-34.9, adult: Secondary | ICD-10-CM | POA: Diagnosis not present

## 2022-11-23 ENCOUNTER — Other Ambulatory Visit (HOSPITAL_COMMUNITY): Payer: Self-pay

## 2022-11-23 MED ORDER — WEGOVY 1.7 MG/0.75ML ~~LOC~~ SOAJ
1.7000 mg | SUBCUTANEOUS | 0 refills | Status: DC
  Filled 2022-11-23: qty 3, 28d supply, fill #0

## 2022-11-23 MED ORDER — ONDANSETRON HCL 8 MG PO TABS
8.0000 mg | ORAL_TABLET | Freq: Three times a day (TID) | ORAL | 0 refills | Status: AC
  Filled 2022-11-23: qty 30, 10d supply, fill #0

## 2022-11-24 ENCOUNTER — Ambulatory Visit: Payer: Commercial Managed Care - PPO | Admitting: Behavioral Health

## 2022-11-27 ENCOUNTER — Ambulatory Visit: Payer: No Typology Code available for payment source | Admitting: Physician Assistant

## 2022-12-08 ENCOUNTER — Other Ambulatory Visit (HOSPITAL_COMMUNITY): Payer: Self-pay

## 2022-12-08 ENCOUNTER — Encounter: Payer: Self-pay | Admitting: Physician Assistant

## 2022-12-08 ENCOUNTER — Ambulatory Visit (INDEPENDENT_AMBULATORY_CARE_PROVIDER_SITE_OTHER): Payer: Commercial Managed Care - PPO | Admitting: Physician Assistant

## 2022-12-08 VITALS — BP 127/89 | HR 76 | Temp 97.8°F | Ht 63.0 in | Wt 189.4 lb

## 2022-12-08 DIAGNOSIS — D508 Other iron deficiency anemias: Secondary | ICD-10-CM | POA: Diagnosis not present

## 2022-12-08 DIAGNOSIS — R5383 Other fatigue: Secondary | ICD-10-CM | POA: Diagnosis not present

## 2022-12-08 DIAGNOSIS — R7303 Prediabetes: Secondary | ICD-10-CM

## 2022-12-08 LAB — CBC WITH DIFFERENTIAL/PLATELET
Basophils Absolute: 0 10*3/uL (ref 0.0–0.1)
Basophils Relative: 0.9 % (ref 0.0–3.0)
Eosinophils Absolute: 0.1 10*3/uL (ref 0.0–0.7)
Eosinophils Relative: 2.2 % (ref 0.0–5.0)
HCT: 42.1 % (ref 36.0–46.0)
Hemoglobin: 14.2 g/dL (ref 12.0–15.0)
Lymphocytes Relative: 46.7 % — ABNORMAL HIGH (ref 12.0–46.0)
Lymphs Abs: 2.5 10*3/uL (ref 0.7–4.0)
MCHC: 33.6 g/dL (ref 30.0–36.0)
MCV: 90.7 fl (ref 78.0–100.0)
Monocytes Absolute: 0.5 10*3/uL (ref 0.1–1.0)
Monocytes Relative: 8.6 % (ref 3.0–12.0)
Neutro Abs: 2.2 10*3/uL (ref 1.4–7.7)
Neutrophils Relative %: 41.6 % — ABNORMAL LOW (ref 43.0–77.0)
Platelets: 319 10*3/uL (ref 150.0–400.0)
RBC: 4.65 Mil/uL (ref 3.87–5.11)
RDW: 12.9 % (ref 11.5–15.5)
WBC: 5.3 10*3/uL (ref 4.0–10.5)

## 2022-12-08 LAB — COMPREHENSIVE METABOLIC PANEL
ALT: 9 U/L (ref 0–35)
AST: 11 U/L (ref 0–37)
Albumin: 4.2 g/dL (ref 3.5–5.2)
Alkaline Phosphatase: 43 U/L (ref 39–117)
BUN: 12 mg/dL (ref 6–23)
CO2: 29 mEq/L (ref 19–32)
Calcium: 9.8 mg/dL (ref 8.4–10.5)
Chloride: 102 mEq/L (ref 96–112)
Creatinine, Ser: 0.78 mg/dL (ref 0.40–1.20)
GFR: 99.45 mL/min (ref >60.00)
Glucose, Bld: 82 mg/dL (ref 70–99)
Potassium: 4.4 mEq/L (ref 3.5–5.1)
Sodium: 138 mEq/L (ref 135–145)
Total Bilirubin: 0.6 mg/dL (ref 0.2–1.2)
Total Protein: 7 g/dL (ref 6.0–8.3)

## 2022-12-08 LAB — IBC + FERRITIN
Ferritin: 42.9 ng/mL (ref 10.0–291.0)
Iron: 102 ug/dL (ref 42–145)
Saturation Ratios: 31.8 % (ref 20.0–50.0)
TIBC: 320.6 ug/dL (ref 250.0–450.0)
Transferrin: 229 mg/dL (ref 212.0–360.0)

## 2022-12-08 LAB — HEMOGLOBIN A1C: Hgb A1c MFr Bld: 5.6 % (ref 4.6–6.5)

## 2022-12-08 LAB — VITAMIN D 25 HYDROXY (VIT D DEFICIENCY, FRACTURES): VITD: 17.87 ng/mL — ABNORMAL LOW (ref 30.00–100.00)

## 2022-12-08 LAB — VITAMIN B12: Vitamin B-12: 795 pg/mL (ref 211–911)

## 2022-12-08 NOTE — Patient Instructions (Signed)
Great to see you! Labs today, treat pending any abnormals. ??Wegovy - related fatigue.   Keep up great work with health!
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
## 2022-12-11 ENCOUNTER — Other Ambulatory Visit (HOSPITAL_COMMUNITY): Payer: Self-pay

## 2022-12-11 LAB — THYROID PANEL WITH TSH
Free Thyroxine Index: 2 (ref 1.4–3.8)
T3 Uptake: 29 % (ref 22–35)
T4, Total: 6.9 ug/dL (ref 5.1–11.9)
TSH: 1.01 mIU/L

## 2022-12-11 LAB — THYROID PEROXIDASE ANTIBODIES (TPO) (REFL): Thyroperoxidase Ab SerPl-aCnc: 1 IU/mL (ref <9)

## 2022-12-11 MED ORDER — WEGOVY 1.7 MG/0.75ML ~~LOC~~ SOAJ
1.7000 mg | SUBCUTANEOUS | 0 refills | Status: DC
  Filled 2022-12-11: qty 3, 28d supply, fill #0

## 2022-12-12 ENCOUNTER — Other Ambulatory Visit: Payer: Self-pay

## 2022-12-12 ENCOUNTER — Other Ambulatory Visit (HOSPITAL_COMMUNITY): Payer: Self-pay

## 2022-12-12 MED ORDER — WEGOVY 2.4 MG/0.75ML ~~LOC~~ SOAJ
2.4000 mg | SUBCUTANEOUS | 0 refills | Status: DC
  Filled 2022-12-12: qty 3, 28d supply, fill #0

## 2022-12-12 MED ORDER — VITAMIN D (ERGOCALCIFEROL) 1.25 MG (50000 UNIT) PO CAPS
50000.0000 [IU] | ORAL_CAPSULE | ORAL | 0 refills | Status: DC
  Filled 2022-12-12: qty 12, 84d supply, fill #0

## 2022-12-13 ENCOUNTER — Other Ambulatory Visit (HOSPITAL_COMMUNITY): Payer: Self-pay

## 2022-12-20 DIAGNOSIS — R7303 Prediabetes: Secondary | ICD-10-CM | POA: Diagnosis not present

## 2022-12-20 DIAGNOSIS — E669 Obesity, unspecified: Secondary | ICD-10-CM | POA: Diagnosis not present

## 2022-12-20 DIAGNOSIS — Z6832 Body mass index (BMI) 32.0-32.9, adult: Secondary | ICD-10-CM | POA: Diagnosis not present

## 2022-12-20 DIAGNOSIS — E282 Polycystic ovarian syndrome: Secondary | ICD-10-CM | POA: Diagnosis not present

## 2022-12-21 ENCOUNTER — Other Ambulatory Visit: Payer: Self-pay

## 2022-12-27 ENCOUNTER — Other Ambulatory Visit (HOSPITAL_COMMUNITY): Payer: Self-pay

## 2022-12-28 ENCOUNTER — Other Ambulatory Visit (HOSPITAL_COMMUNITY): Payer: Self-pay

## 2022-12-29 ENCOUNTER — Other Ambulatory Visit (HOSPITAL_COMMUNITY): Payer: Self-pay

## 2023-01-10 ENCOUNTER — Other Ambulatory Visit (HOSPITAL_COMMUNITY): Payer: Self-pay

## 2023-01-10 MED ORDER — WEGOVY 2.4 MG/0.75ML ~~LOC~~ SOAJ
2.4000 mg | SUBCUTANEOUS | 0 refills | Status: DC
  Filled 2023-01-10 – 2023-01-24 (×2): qty 3, 28d supply, fill #0

## 2023-01-16 ENCOUNTER — Other Ambulatory Visit (HOSPITAL_COMMUNITY): Payer: Self-pay

## 2023-01-16 MED ORDER — WEGOVY 2.4 MG/0.75ML ~~LOC~~ SOAJ
2.4000 mg | SUBCUTANEOUS | 0 refills | Status: DC
  Filled 2023-01-16: qty 3, 28d supply, fill #0

## 2023-01-24 ENCOUNTER — Other Ambulatory Visit: Payer: Self-pay

## 2023-01-24 ENCOUNTER — Other Ambulatory Visit (HOSPITAL_COMMUNITY): Payer: Self-pay

## 2023-01-31 DIAGNOSIS — R7303 Prediabetes: Secondary | ICD-10-CM | POA: Diagnosis not present

## 2023-01-31 DIAGNOSIS — Z6832 Body mass index (BMI) 32.0-32.9, adult: Secondary | ICD-10-CM | POA: Diagnosis not present

## 2023-01-31 DIAGNOSIS — E669 Obesity, unspecified: Secondary | ICD-10-CM | POA: Diagnosis not present

## 2023-01-31 DIAGNOSIS — E282 Polycystic ovarian syndrome: Secondary | ICD-10-CM | POA: Diagnosis not present

## 2023-02-01 ENCOUNTER — Other Ambulatory Visit (HOSPITAL_COMMUNITY): Payer: Self-pay

## 2023-02-01 ENCOUNTER — Encounter (HOSPITAL_COMMUNITY): Payer: Self-pay

## 2023-02-01 MED ORDER — ZEPBOUND 15 MG/0.5ML ~~LOC~~ SOAJ
15.0000 mg | SUBCUTANEOUS | 0 refills | Status: DC
  Filled 2023-02-01 – 2023-02-09 (×2): qty 2, 28d supply, fill #0

## 2023-02-09 ENCOUNTER — Other Ambulatory Visit (HOSPITAL_COMMUNITY): Payer: Self-pay

## 2023-02-16 ENCOUNTER — Other Ambulatory Visit (HOSPITAL_COMMUNITY): Payer: Self-pay

## 2023-03-01 ENCOUNTER — Other Ambulatory Visit (HOSPITAL_COMMUNITY): Payer: Self-pay

## 2023-03-28 ENCOUNTER — Other Ambulatory Visit (HOSPITAL_COMMUNITY): Payer: Self-pay

## 2023-03-28 DIAGNOSIS — E282 Polycystic ovarian syndrome: Secondary | ICD-10-CM | POA: Diagnosis not present

## 2023-03-28 DIAGNOSIS — E669 Obesity, unspecified: Secondary | ICD-10-CM | POA: Diagnosis not present

## 2023-03-28 DIAGNOSIS — R7303 Prediabetes: Secondary | ICD-10-CM | POA: Diagnosis not present

## 2023-03-28 DIAGNOSIS — Z6830 Body mass index (BMI) 30.0-30.9, adult: Secondary | ICD-10-CM | POA: Diagnosis not present

## 2023-03-28 MED ORDER — ONDANSETRON HCL 8 MG PO TABS
8.0000 mg | ORAL_TABLET | Freq: Three times a day (TID) | ORAL | 0 refills | Status: DC | PRN
  Filled 2023-03-28: qty 30, 10d supply, fill #0

## 2023-03-28 MED ORDER — ZEPBOUND 15 MG/0.5ML ~~LOC~~ SOAJ
15.0000 mg | SUBCUTANEOUS | 0 refills | Status: DC
  Filled 2023-03-28: qty 2, 28d supply, fill #0

## 2023-04-06 ENCOUNTER — Other Ambulatory Visit (HOSPITAL_COMMUNITY): Payer: Self-pay

## 2023-04-20 ENCOUNTER — Other Ambulatory Visit (HOSPITAL_COMMUNITY)
Admission: RE | Admit: 2023-04-20 | Discharge: 2023-04-20 | Disposition: A | Discharge status: Home / Self Care | Payer: Commercial Managed Care - PPO | Source: Ambulatory Visit | Attending: Oncology | Admitting: Oncology

## 2023-04-20 ENCOUNTER — Other Ambulatory Visit: Payer: Self-pay | Admitting: Oncology

## 2023-04-20 DIAGNOSIS — Z006 Encounter for examination for normal comparison and control in clinical research program: Secondary | ICD-10-CM

## 2023-05-14 ENCOUNTER — Telehealth: Payer: Self-pay | Admitting: Physician Assistant

## 2023-05-14 NOTE — Telephone Encounter (Signed)
Pt is needing a CPE before 06/10/23 for insurance. No openings until Oct. Can she be placed in somewhere sooner? Please advise

## 2023-06-07 ENCOUNTER — Encounter: Payer: Self-pay | Admitting: Physician Assistant

## 2023-06-07 ENCOUNTER — Ambulatory Visit (INDEPENDENT_AMBULATORY_CARE_PROVIDER_SITE_OTHER): Payer: Commercial Managed Care - PPO | Admitting: Physician Assistant

## 2023-06-07 ENCOUNTER — Other Ambulatory Visit (HOSPITAL_COMMUNITY): Payer: Self-pay

## 2023-06-07 VITALS — BP 118/82 | HR 87 | Temp 98.2°F | Ht 63.0 in | Wt 176.9 lb

## 2023-06-07 DIAGNOSIS — R5383 Other fatigue: Secondary | ICD-10-CM | POA: Diagnosis not present

## 2023-06-07 DIAGNOSIS — T7800XA Anaphylactic reaction due to unspecified food, initial encounter: Secondary | ICD-10-CM

## 2023-06-07 DIAGNOSIS — R7303 Prediabetes: Secondary | ICD-10-CM | POA: Diagnosis not present

## 2023-06-07 DIAGNOSIS — Z8639 Personal history of other endocrine, nutritional and metabolic disease: Secondary | ICD-10-CM | POA: Diagnosis not present

## 2023-06-07 DIAGNOSIS — Z0000 Encounter for general adult medical examination without abnormal findings: Secondary | ICD-10-CM | POA: Diagnosis not present

## 2023-06-07 DIAGNOSIS — E559 Vitamin D deficiency, unspecified: Secondary | ICD-10-CM

## 2023-06-07 DIAGNOSIS — E282 Polycystic ovarian syndrome: Secondary | ICD-10-CM | POA: Diagnosis not present

## 2023-06-07 DIAGNOSIS — E663 Overweight: Secondary | ICD-10-CM | POA: Diagnosis not present

## 2023-06-07 DIAGNOSIS — Z6829 Body mass index (BMI) 29.0-29.9, adult: Secondary | ICD-10-CM | POA: Diagnosis not present

## 2023-06-07 LAB — CBC WITH DIFFERENTIAL/PLATELET
Basophils Absolute: 0 10*3/uL (ref 0.0–0.1)
Basophils Relative: 0.7 % (ref 0.0–3.0)
Eosinophils Absolute: 0.1 10*3/uL (ref 0.0–0.7)
Eosinophils Relative: 1 % (ref 0.0–5.0)
HCT: 43.9 % (ref 36.0–46.0)
Hemoglobin: 14.6 g/dL (ref 12.0–15.0)
Lymphocytes Relative: 40.3 % (ref 12.0–46.0)
Lymphs Abs: 2.6 10*3/uL (ref 0.7–4.0)
MCHC: 33.3 g/dL (ref 30.0–36.0)
MCV: 91.2 fl (ref 78.0–100.0)
Monocytes Absolute: 0.4 10*3/uL (ref 0.1–1.0)
Monocytes Relative: 6.8 % (ref 3.0–12.0)
Neutro Abs: 3.3 10*3/uL (ref 1.4–7.7)
Neutrophils Relative %: 51.2 % (ref 43.0–77.0)
Platelets: 382 10*3/uL (ref 150.0–400.0)
RBC: 4.81 Mil/uL (ref 3.87–5.11)
RDW: 12.8 % (ref 11.5–15.5)
WBC: 6.5 10*3/uL (ref 4.0–10.5)

## 2023-06-07 LAB — HEMOGLOBIN A1C: Hgb A1c MFr Bld: 5.4 % (ref 4.6–6.5)

## 2023-06-07 LAB — COMPREHENSIVE METABOLIC PANEL
ALT: 11 U/L (ref 0–35)
AST: 18 U/L (ref 0–37)
Albumin: 4.8 g/dL (ref 3.5–5.2)
Alkaline Phosphatase: 47 U/L (ref 39–117)
BUN: 15 mg/dL (ref 6–23)
CO2: 24 meq/L (ref 19–32)
Calcium: 10 mg/dL (ref 8.4–10.5)
Chloride: 102 meq/L (ref 96–112)
Creatinine, Ser: 0.87 mg/dL (ref 0.40–1.20)
GFR: 86.93 mL/min (ref >60.00)
Glucose, Bld: 86 mg/dL (ref 70–99)
Potassium: 3.5 meq/L (ref 3.5–5.1)
Sodium: 137 meq/L (ref 135–145)
Total Bilirubin: 0.6 mg/dL (ref 0.2–1.2)
Total Protein: 8.3 g/dL (ref 6.0–8.3)

## 2023-06-07 LAB — LIPID PANEL
Cholesterol: 218 mg/dL — ABNORMAL HIGH (ref 0–200)
HDL: 61 mg/dL (ref >39.00)
LDL Cholesterol: 144 mg/dL — ABNORMAL HIGH (ref 0–99)
NonHDL: 156.89
Total CHOL/HDL Ratio: 4
Triglycerides: 64 mg/dL (ref 0.0–149.0)
VLDL: 12.8 mg/dL (ref 0.0–40.0)

## 2023-06-07 MED ORDER — ZEPBOUND 15 MG/0.5ML ~~LOC~~ SOAJ
15.0000 mg | SUBCUTANEOUS | 2 refills | Status: AC
  Filled 2023-06-07: qty 2, 28d supply, fill #0

## 2023-06-07 MED ORDER — EPINEPHRINE 0.3 MG/0.3ML IJ SOAJ
0.3000 mg | INTRAMUSCULAR | 2 refills | Status: DC | PRN
Start: 2023-06-07 — End: 2024-04-02
  Filled 2023-06-07: qty 2, 2d supply, fill #0
  Filled 2024-04-02: qty 2, 2d supply, fill #1

## 2023-06-07 NOTE — Progress Notes (Signed)
Subjective:    Patient ID: Jennifer Solis, female    DOB: 13-Feb-1991, 32 y.o.   MRN: [id number]  Chief Complaint  Patient presents with   Annual Exam    Pt states needs new Epi pen. Pt c/o of still constantly being tired and not sleeping well     HPI Patient is in today for annual exam.  Acute concerns: Needs epi-pen refilled   Health maintenance: Lifestyle/ exercise: Currently on Zepbound; walking the treadmill Nutrition: Good balanced meals, better with water  Mental health: states she needs to start seeing counseling again, denies SI or HI Sleep: Still feels tired; averaging about 5 hours per night, very interrupted sleep; works 11 am - 11 pm (4-5 shifts per week). Hobbies: Pottery class on Mondays  Substance use: none  Sexual activity: not active  Immunizations: UTD - flu shot at work  Pap: UTD  Cycles: Regular  Skin: no issues    Past Medical History:  Diagnosis Date   ADHD    Complication of anesthesia    wakes up during last 2 knee surgeries, and nasal surgery   Family history of adverse reaction to anesthesia    sisters problems with waking up after surgery   History of kidney stones 2017   Hypertension    Migraines    Obsessive compulsive disorder    Pneumonia    Polycystic ovarian syndrome    PONV (postoperative nausea and vomiting)    TBI (traumatic brain injury) ([facility]) 2012   with skull fracture after fall     Past Surgical History:  Procedure Laterality Date   CHOLECYSTECTOMY N/A 06/19/2022   Procedure: LAPAROSCOPIC CHOLECYSTECTOMY WITH INTRAOPERATIVE CHOLANGIOGRAM;  Surgeon: Darnell Level, MD;  Location: [facility];  Service: General;  Laterality: N/A;   KNEE SURGERY Left    RHINOPLASTY  10/09/2009   WISDOM TOOTH EXTRACTION  2007    Family History  Problem Relation Age of Onset   Breast cancer Maternal Grandmother    Breast cancer Paternal Grandmother    Breast cancer Cousin     Social History   Tobacco Use   Smoking status: Never   Smokeless  tobacco: Never  Vaping Use   Vaping status: Never Used  Substance Use Topics   Alcohol use: Yes    Comment: maybe 1 drink per month   Drug use: Never     Allergies  Allergen Reactions   Mango Flavor Anaphylaxis, Hives, Nausea And Vomiting and Swelling   Acetaminophen Other (See Comments)    Paralysis Only extra Strength   Augmentin [Amoxicillin-Pot Clavulanate] Nausea And Vomiting    Review of Systems NEGATIVE UNLESS OTHERWISE INDICATED IN HPI      Objective:     BP 118/82 (BP Location: Left Arm, Patient Position: Sitting, Cuff Size: Normal)   Pulse 87   Temp 98.2 F (36.8 C) (Temporal)   Ht 5\' 3"  (1.6 m)   Wt 176 lb 14.4 oz (80.2 kg)   SpO2 99%   BMI 31.34 kg/m   Wt Readings from Last 3 Encounters:  06/07/23 176 lb 14.4 oz (80.2 kg)  12/08/22 189 lb 6.4 oz (85.9 kg)  06/19/22 209 lb (94.8 kg)    BP Readings from Last 3 Encounters:  06/07/23 118/82  12/08/22 127/89  06/19/22 112/67     Physical Exam Vitals and nursing note reviewed.  Constitutional:      Appearance: Normal appearance. She is normal weight. She is not toxic-appearing.  HENT:     Head: Normocephalic and  atraumatic.     Right Ear: Tympanic membrane, ear canal and external ear normal.     Left Ear: Tympanic membrane, ear canal and external ear normal.     Nose: Nose normal.     Mouth/Throat:     Mouth: Mucous membranes are moist.  Eyes:     Extraocular Movements: Extraocular movements intact.     Conjunctiva/sclera: Conjunctivae normal.     Pupils: Pupils are equal, round, and reactive to light.  Cardiovascular:     Rate and Rhythm: Normal rate and regular rhythm.     Pulses: Normal pulses.     Heart sounds: Normal heart sounds.  Pulmonary:     Effort: Pulmonary effort is normal.     Breath sounds: Normal breath sounds.  Chest:     Chest wall: No mass, swelling or tenderness.  Breasts:    Right: Normal.     Left: Normal.  Abdominal:     General: Abdomen is flat. Bowel sounds are  normal.     Palpations: Abdomen is soft.  Musculoskeletal:        General: Normal range of motion.     Cervical back: Normal range of motion and neck supple.  Lymphadenopathy:     Upper Body:     Right upper body: No supraclavicular, axillary or pectoral adenopathy.     Left upper body: No supraclavicular, axillary or pectoral adenopathy.  Skin:    General: Skin is warm and dry.  Neurological:     General: No focal deficit present.     Mental Status: She is alert and oriented to person, place, and time.  Psychiatric:        Mood and Affect: Mood normal.        Behavior: Behavior normal.        Thought Content: Thought content normal.        Judgment: Judgment normal.        Assessment & Plan:  Encounter for annual physical exam -     Lipid panel -     Comprehensive metabolic panel -     CBC with Differential/Platelet -     Hemoglobin A1c -     TSH -     VITAMIN D 25 Hydroxy (Vit-D Deficiency, Fractures)  Other fatigue -     Comprehensive metabolic panel -     CBC with Differential/Platelet -     TSH -     VITAMIN D 25 Hydroxy (Vit-D Deficiency, Fractures)  Vitamin D deficiency -     VITAMIN D 25 Hydroxy (Vit-D Deficiency, Fractures)  Anaphylactic reaction due to food -     EPINEPHrine; Inject 0.3 mg into the muscle as needed for anaphylaxis.  Dispense: 1 each; Refill: 2   Age-appropriate screening and counseling performed today. Will check labs and call with results. Preventive measures discussed and printed in AVS for patient.   Patient Counseling: [x]   Nutrition: Stressed importance of moderation in sodium/caffeine intake, saturated fat and cholesterol, caloric balance, sufficient intake of fresh fruits, vegetables, and fiber.  [x]   Stressed the importance of regular exercise.   []   Substance Abuse: Discussed cessation/primary prevention of tobacco, alcohol, or other drug use; driving or other dangerous activities under the influence; availability of treatment for  abuse.   []   Injury prevention: Discussed safety belts, safety helmets, smoke detector, smoking near bedding or upholstery.   [x]   Sexuality: Discussed sexually transmitted diseases, partner selection, use of condoms, avoidance of unintended pregnancy  and contraceptive  alternatives.   [x]   Dental health: Discussed importance of regular tooth brushing, flossing, and dental visits.  [x]   Health maintenance and immunizations reviewed. Please refer to Health maintenance section.        Return in about 1 year (around 06/06/2024) for physical.   Ayumi Wangerin M Denece Shearer, PA-C

## 2023-06-07 NOTE — Patient Instructions (Signed)
Wonderful to see you! Keep up the great work!!

## 2023-06-08 LAB — TSH: TSH: 1.26 u[IU]/mL (ref 0.35–5.50)

## 2023-06-08 LAB — VITAMIN D 25 HYDROXY (VIT D DEFICIENCY, FRACTURES): VITD: 40.28 ng/mL (ref 30.00–100.00)

## 2023-06-15 ENCOUNTER — Other Ambulatory Visit (HOSPITAL_COMMUNITY): Payer: Self-pay

## 2023-06-16 ENCOUNTER — Emergency Department (HOSPITAL_COMMUNITY)
Admission: EM | Admit: 2023-06-16 | Discharge: 2023-06-16 | Disposition: A | Discharge status: Home / Self Care | Payer: PRIVATE HEALTH INSURANCE | Attending: Student in an Organized Health Care Education/Training Program | Admitting: Student in an Organized Health Care Education/Training Program

## 2023-06-16 ENCOUNTER — Encounter (HOSPITAL_COMMUNITY): Payer: Self-pay | Admitting: *Deleted

## 2023-06-16 ENCOUNTER — Emergency Department (HOSPITAL_COMMUNITY): Payer: PRIVATE HEALTH INSURANCE

## 2023-06-16 DIAGNOSIS — H538 Other visual disturbances: Secondary | ICD-10-CM | POA: Insufficient documentation

## 2023-06-16 DIAGNOSIS — Z79899 Other long term (current) drug therapy: Secondary | ICD-10-CM | POA: Insufficient documentation

## 2023-06-16 DIAGNOSIS — Y990 Civilian activity done for income or pay: Secondary | ICD-10-CM | POA: Diagnosis not present

## 2023-06-16 DIAGNOSIS — Z7984 Long term (current) use of oral hypoglycemic drugs: Secondary | ICD-10-CM | POA: Diagnosis not present

## 2023-06-16 DIAGNOSIS — S0993XA Unspecified injury of face, initial encounter: Secondary | ICD-10-CM | POA: Insufficient documentation

## 2023-06-16 DIAGNOSIS — F419 Anxiety disorder, unspecified: Secondary | ICD-10-CM | POA: Insufficient documentation

## 2023-06-16 MED ORDER — KETOROLAC TROMETHAMINE 30 MG/ML IJ SOLN
30.0000 mg | Freq: Once | INTRAMUSCULAR | Status: AC
  Administered 2023-06-16: 30 mg via INTRAMUSCULAR
  Filled 2023-06-16: qty 1

## 2023-06-16 MED ORDER — KETOROLAC TROMETHAMINE 60 MG/2ML IM SOLN
30.0000 mg | Freq: Once | INTRAMUSCULAR | Status: DC
  Filled 2023-06-16: qty 2

## 2023-06-16 MED ORDER — HYDROXYZINE HCL 25 MG PO TABS
25.0000 mg | ORAL_TABLET | Freq: Once | ORAL | Status: AC
  Administered 2023-06-16: 25 mg via ORAL
  Filled 2023-06-16: qty 1
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
## 2023-07-24 ENCOUNTER — Other Ambulatory Visit (HOSPITAL_COMMUNITY): Payer: Self-pay

## 2023-07-24 DIAGNOSIS — Z8639 Personal history of other endocrine, nutritional and metabolic disease: Secondary | ICD-10-CM | POA: Diagnosis not present

## 2023-07-24 DIAGNOSIS — E282 Polycystic ovarian syndrome: Secondary | ICD-10-CM | POA: Diagnosis not present

## 2023-07-24 DIAGNOSIS — Z6829 Body mass index (BMI) 29.0-29.9, adult: Secondary | ICD-10-CM | POA: Diagnosis not present

## 2023-07-24 DIAGNOSIS — R7303 Prediabetes: Secondary | ICD-10-CM | POA: Diagnosis not present

## 2023-07-24 DIAGNOSIS — E663 Overweight: Secondary | ICD-10-CM | POA: Diagnosis not present

## 2023-07-24 DIAGNOSIS — G479 Sleep disorder, unspecified: Secondary | ICD-10-CM | POA: Diagnosis not present

## 2023-07-24 MED ORDER — ZEPBOUND 15 MG/0.5ML ~~LOC~~ SOAJ
15.0000 mg | SUBCUTANEOUS | 2 refills | Status: AC
  Filled 2023-07-24: qty 2, 28d supply, fill #0

## 2023-07-25 ENCOUNTER — Other Ambulatory Visit (HOSPITAL_COMMUNITY): Payer: Self-pay

## 2023-07-25 MED ORDER — HYDROXYZINE HCL 25 MG PO TABS
25.0000 mg | ORAL_TABLET | Freq: Every day | ORAL | 0 refills | Status: AC
  Filled 2023-07-25: qty 30, 30d supply, fill #0

## 2023-07-27 ENCOUNTER — Other Ambulatory Visit (HOSPITAL_COMMUNITY): Payer: Self-pay

## 2023-07-27 ENCOUNTER — Other Ambulatory Visit: Payer: Self-pay | Admitting: Physician Assistant

## 2023-07-27 MED ORDER — HYDROCHLOROTHIAZIDE 12.5 MG PO TABS
12.5000 mg | ORAL_TABLET | Freq: Every day | ORAL | 3 refills | Status: AC
  Filled 2023-07-27: qty 90, 90d supply, fill #0

## 2023-07-27 MED ORDER — AMLODIPINE BESYLATE 10 MG PO TABS
10.0000 mg | ORAL_TABLET | Freq: Every day | ORAL | 3 refills | Status: AC
  Filled 2023-07-27: qty 90, 90d supply, fill #0
  Filled 2024-04-02: qty 90, 90d supply, fill #1

## 2023-07-30 ENCOUNTER — Other Ambulatory Visit (HOSPITAL_COMMUNITY): Payer: Self-pay

## 2023-08-28 DIAGNOSIS — Z8639 Personal history of other endocrine, nutritional and metabolic disease: Secondary | ICD-10-CM | POA: Diagnosis not present

## 2023-08-28 DIAGNOSIS — G479 Sleep disorder, unspecified: Secondary | ICD-10-CM | POA: Diagnosis not present

## 2023-08-28 DIAGNOSIS — Z6828 Body mass index (BMI) 28.0-28.9, adult: Secondary | ICD-10-CM | POA: Diagnosis not present

## 2023-08-28 DIAGNOSIS — E282 Polycystic ovarian syndrome: Secondary | ICD-10-CM | POA: Diagnosis not present

## 2023-08-28 DIAGNOSIS — E663 Overweight: Secondary | ICD-10-CM | POA: Diagnosis not present

## 2023-08-28 DIAGNOSIS — R7303 Prediabetes: Secondary | ICD-10-CM | POA: Diagnosis not present

## 2023-10-23 ENCOUNTER — Other Ambulatory Visit (HOSPITAL_COMMUNITY): Payer: Self-pay

## 2023-10-23 DIAGNOSIS — E282 Polycystic ovarian syndrome: Secondary | ICD-10-CM | POA: Diagnosis not present

## 2023-10-23 DIAGNOSIS — Z6827 Body mass index (BMI) 27.0-27.9, adult: Secondary | ICD-10-CM | POA: Diagnosis not present

## 2023-10-23 DIAGNOSIS — R7303 Prediabetes: Secondary | ICD-10-CM | POA: Diagnosis not present

## 2023-10-23 DIAGNOSIS — Z8639 Personal history of other endocrine, nutritional and metabolic disease: Secondary | ICD-10-CM | POA: Diagnosis not present

## 2023-10-23 DIAGNOSIS — E663 Overweight: Secondary | ICD-10-CM | POA: Diagnosis not present

## 2023-10-23 MED ORDER — ZEPBOUND 15 MG/0.5ML ~~LOC~~ SOAJ
15.0000 mg | SUBCUTANEOUS | 2 refills | Status: AC
  Filled 2023-10-23: qty 2, 28d supply, fill #0

## 2024-01-04 ENCOUNTER — Other Ambulatory Visit (HOSPITAL_COMMUNITY): Payer: Self-pay

## 2024-01-04 DIAGNOSIS — Z1151 Encounter for screening for human papillomavirus (HPV): Secondary | ICD-10-CM | POA: Diagnosis not present

## 2024-01-04 DIAGNOSIS — N946 Dysmenorrhea, unspecified: Secondary | ICD-10-CM | POA: Diagnosis not present

## 2024-01-04 DIAGNOSIS — Z124 Encounter for screening for malignant neoplasm of cervix: Secondary | ICD-10-CM | POA: Diagnosis not present

## 2024-01-04 DIAGNOSIS — Z01419 Encounter for gynecological examination (general) (routine) without abnormal findings: Secondary | ICD-10-CM | POA: Diagnosis not present

## 2024-01-04 DIAGNOSIS — Z6828 Body mass index (BMI) 28.0-28.9, adult: Secondary | ICD-10-CM | POA: Diagnosis not present

## 2024-01-04 DIAGNOSIS — Z13228 Encounter for screening for other metabolic disorders: Secondary | ICD-10-CM | POA: Diagnosis not present

## 2024-01-04 DIAGNOSIS — E282 Polycystic ovarian syndrome: Secondary | ICD-10-CM | POA: Diagnosis not present

## 2024-01-04 DIAGNOSIS — R58 Hemorrhage, not elsewhere classified: Secondary | ICD-10-CM | POA: Diagnosis not present

## 2024-01-04 MED ORDER — METFORMIN HCL 500 MG PO TABS
500.0000 mg | ORAL_TABLET | Freq: Every day | ORAL | 3 refills | Status: AC
Start: 2024-01-04 — End: ?
  Filled 2024-01-04: qty 90, 90d supply, fill #0
  Filled 2024-08-20: qty 90, 90d supply, fill #1

## 2024-01-04 MED ORDER — NAPROXEN 500 MG PO TABS
500.0000 mg | ORAL_TABLET | Freq: Two times a day (BID) | ORAL | 2 refills | Status: AC
  Filled 2024-01-04: qty 60, 30d supply, fill #0
  Filled 2024-04-02: qty 60, 30d supply, fill #1

## 2024-01-08 ENCOUNTER — Other Ambulatory Visit (HOSPITAL_COMMUNITY): Payer: Self-pay

## 2024-01-08 ENCOUNTER — Other Ambulatory Visit: Payer: Self-pay

## 2024-01-08 DIAGNOSIS — R7303 Prediabetes: Secondary | ICD-10-CM | POA: Diagnosis not present

## 2024-01-08 DIAGNOSIS — Z6828 Body mass index (BMI) 28.0-28.9, adult: Secondary | ICD-10-CM | POA: Diagnosis not present

## 2024-01-08 DIAGNOSIS — F908 Attention-deficit hyperactivity disorder, other type: Secondary | ICD-10-CM | POA: Diagnosis not present

## 2024-01-08 DIAGNOSIS — E663 Overweight: Secondary | ICD-10-CM | POA: Diagnosis not present

## 2024-01-08 DIAGNOSIS — E282 Polycystic ovarian syndrome: Secondary | ICD-10-CM | POA: Diagnosis not present

## 2024-01-08 DIAGNOSIS — Z8639 Personal history of other endocrine, nutritional and metabolic disease: Secondary | ICD-10-CM | POA: Diagnosis not present

## 2024-01-08 MED ORDER — LISDEXAMFETAMINE DIMESYLATE 30 MG PO CHEW
30.0000 mg | CHEWABLE_TABLET | Freq: Every morning | ORAL | 0 refills | Status: AC
Start: 2024-01-08 — End: ?
  Filled 2024-01-08: qty 30, 30d supply, fill #0

## 2024-01-08 MED ORDER — ZEPBOUND 15 MG/0.5ML ~~LOC~~ SOAJ
15.0000 mg | SUBCUTANEOUS | 2 refills | Status: AC
Start: 2024-01-08 — End: ?
  Filled 2024-01-08 – 2024-01-29 (×2): qty 2, 28d supply, fill #0

## 2024-01-29 ENCOUNTER — Other Ambulatory Visit (HOSPITAL_COMMUNITY): Payer: Self-pay

## 2024-01-31 DIAGNOSIS — N946 Dysmenorrhea, unspecified: Secondary | ICD-10-CM | POA: Diagnosis not present

## 2024-01-31 DIAGNOSIS — E282 Polycystic ovarian syndrome: Secondary | ICD-10-CM | POA: Diagnosis not present

## 2024-02-21 ENCOUNTER — Other Ambulatory Visit (HOSPITAL_COMMUNITY): Payer: Self-pay

## 2024-02-21 DIAGNOSIS — Z6827 Body mass index (BMI) 27.0-27.9, adult: Secondary | ICD-10-CM | POA: Diagnosis not present

## 2024-02-21 DIAGNOSIS — E282 Polycystic ovarian syndrome: Secondary | ICD-10-CM | POA: Diagnosis not present

## 2024-02-21 DIAGNOSIS — F908 Attention-deficit hyperactivity disorder, other type: Secondary | ICD-10-CM | POA: Diagnosis not present

## 2024-02-21 DIAGNOSIS — R7303 Prediabetes: Secondary | ICD-10-CM | POA: Diagnosis not present

## 2024-02-21 DIAGNOSIS — E663 Overweight: Secondary | ICD-10-CM | POA: Diagnosis not present

## 2024-02-21 DIAGNOSIS — Z8639 Personal history of other endocrine, nutritional and metabolic disease: Secondary | ICD-10-CM | POA: Diagnosis not present

## 2024-02-21 MED ORDER — LISDEXAMFETAMINE DIMESYLATE 50 MG PO CAPS
50.0000 mg | ORAL_CAPSULE | Freq: Every morning | ORAL | 0 refills | Status: DC
Start: 2024-02-21 — End: 2024-04-03
  Filled 2024-02-21: qty 30, 30d supply, fill #0

## 2024-04-02 ENCOUNTER — Other Ambulatory Visit: Payer: Self-pay | Admitting: Physician Assistant

## 2024-04-02 ENCOUNTER — Other Ambulatory Visit: Payer: Self-pay

## 2024-04-02 ENCOUNTER — Other Ambulatory Visit (HOSPITAL_COMMUNITY): Payer: Self-pay

## 2024-04-02 DIAGNOSIS — T7800XA Anaphylactic reaction due to unspecified food, initial encounter: Secondary | ICD-10-CM

## 2024-04-02 MED ORDER — EPINEPHRINE 0.3 MG/0.3ML IJ SOAJ
0.3000 mg | INTRAMUSCULAR | 2 refills | Status: AC | PRN
  Filled 2024-04-02: qty 2, 14d supply, fill #0

## 2024-04-03 ENCOUNTER — Other Ambulatory Visit (HOSPITAL_COMMUNITY): Payer: Self-pay

## 2024-04-03 DIAGNOSIS — Z8639 Personal history of other endocrine, nutritional and metabolic disease: Secondary | ICD-10-CM | POA: Diagnosis not present

## 2024-04-03 DIAGNOSIS — E282 Polycystic ovarian syndrome: Secondary | ICD-10-CM | POA: Diagnosis not present

## 2024-04-03 DIAGNOSIS — R7303 Prediabetes: Secondary | ICD-10-CM | POA: Diagnosis not present

## 2024-04-03 DIAGNOSIS — E663 Overweight: Secondary | ICD-10-CM | POA: Diagnosis not present

## 2024-04-03 DIAGNOSIS — Z6827 Body mass index (BMI) 27.0-27.9, adult: Secondary | ICD-10-CM | POA: Diagnosis not present

## 2024-04-03 DIAGNOSIS — F908 Attention-deficit hyperactivity disorder, other type: Secondary | ICD-10-CM | POA: Diagnosis not present

## 2024-04-03 MED ORDER — LISDEXAMFETAMINE DIMESYLATE 50 MG PO CAPS
50.0000 mg | ORAL_CAPSULE | Freq: Every morning | ORAL | 0 refills | Status: DC
Start: 2024-04-03 — End: 2024-07-24
  Filled 2024-04-03: qty 30, 30d supply, fill #0

## 2024-04-04 ENCOUNTER — Other Ambulatory Visit (HOSPITAL_COMMUNITY): Payer: Self-pay

## 2024-05-15 DIAGNOSIS — F908 Attention-deficit hyperactivity disorder, other type: Secondary | ICD-10-CM | POA: Diagnosis not present

## 2024-05-15 DIAGNOSIS — E282 Polycystic ovarian syndrome: Secondary | ICD-10-CM | POA: Diagnosis not present

## 2024-05-15 DIAGNOSIS — R7303 Prediabetes: Secondary | ICD-10-CM | POA: Diagnosis not present

## 2024-05-15 DIAGNOSIS — Z8639 Personal history of other endocrine, nutritional and metabolic disease: Secondary | ICD-10-CM | POA: Diagnosis not present

## 2024-05-15 DIAGNOSIS — E663 Overweight: Secondary | ICD-10-CM | POA: Diagnosis not present

## 2024-05-15 DIAGNOSIS — Z6826 Body mass index (BMI) 26.0-26.9, adult: Secondary | ICD-10-CM | POA: Diagnosis not present

## 2024-07-22 ENCOUNTER — Other Ambulatory Visit (HOSPITAL_COMMUNITY): Payer: Self-pay

## 2024-07-22 MED ORDER — ZEPBOUND 10 MG/0.5ML ~~LOC~~ SOAJ
10.0000 mg | SUBCUTANEOUS | 1 refills | Status: AC
Start: 2024-07-22 — End: ?
  Filled 2024-07-22: qty 2, 28d supply, fill #0

## 2024-07-22 MED ORDER — BUPROPION HCL ER (XL) 150 MG PO TB24
150.0000 mg | ORAL_TABLET | ORAL | 1 refills | Status: AC
Start: 2024-07-22 — End: ?
  Filled 2024-07-22: qty 30, 30d supply, fill #0
  Filled 2024-08-20: qty 30, 30d supply, fill #1

## 2024-07-24 ENCOUNTER — Other Ambulatory Visit (HOSPITAL_COMMUNITY): Payer: Self-pay

## 2024-07-24 MED ORDER — LISDEXAMFETAMINE DIMESYLATE 50 MG PO CAPS
50.0000 mg | ORAL_CAPSULE | Freq: Every morning | ORAL | 0 refills | Status: DC
Start: 2024-07-24 — End: 2024-08-20
  Filled 2024-07-24: qty 30, 30d supply, fill #0

## 2024-08-20 ENCOUNTER — Other Ambulatory Visit: Payer: Self-pay

## 2024-08-20 ENCOUNTER — Encounter (HOSPITAL_COMMUNITY): Payer: Self-pay

## 2024-08-20 ENCOUNTER — Other Ambulatory Visit (HOSPITAL_COMMUNITY): Payer: Self-pay

## 2024-08-20 MED ORDER — LISDEXAMFETAMINE DIMESYLATE 50 MG PO CAPS
50.0000 mg | ORAL_CAPSULE | Freq: Every morning | ORAL | 0 refills | Status: AC
Start: 2024-08-20 — End: ?
  Filled 2024-08-21: qty 30, 30d supply, fill #0

## 2024-08-21 ENCOUNTER — Other Ambulatory Visit (HOSPITAL_COMMUNITY): Payer: Self-pay

## 2024-10-24 ENCOUNTER — Other Ambulatory Visit (HOSPITAL_COMMUNITY): Payer: Self-pay
